# Patient Record
Sex: Male | Born: 1975
Health system: Southern US, Community
[De-identification: ages and names within clinical notes are randomized; demographics above are authoritative.]

## PROBLEM LIST (undated history)

## (undated) DIAGNOSIS — I10 Essential (primary) hypertension: Secondary | ICD-10-CM

## (undated) HISTORY — PX: FINGER SURGERY: SHX640

## (undated) HISTORY — DX: Essential (primary) hypertension: I10

---

## 1998-04-13 ENCOUNTER — Emergency Department (HOSPITAL_COMMUNITY): Admission: EM | Admit: 1998-04-13 | Discharge: 1998-04-13 | Payer: Self-pay | Admitting: Emergency Medicine

## 1998-04-13 ENCOUNTER — Encounter: Payer: Self-pay | Admitting: Emergency Medicine

## 1998-07-17 ENCOUNTER — Emergency Department (HOSPITAL_COMMUNITY): Admission: EM | Admit: 1998-07-17 | Discharge: 1998-07-17 | Payer: Self-pay | Admitting: Emergency Medicine

## 1999-11-02 ENCOUNTER — Emergency Department (HOSPITAL_COMMUNITY): Admission: EM | Admit: 1999-11-02 | Discharge: 1999-11-02 | Payer: Self-pay | Admitting: Emergency Medicine

## 2000-02-28 ENCOUNTER — Emergency Department (HOSPITAL_COMMUNITY): Admission: EM | Admit: 2000-02-28 | Discharge: 2000-02-28 | Payer: Self-pay | Admitting: Emergency Medicine

## 2000-02-28 ENCOUNTER — Encounter: Payer: Self-pay | Admitting: Emergency Medicine

## 2001-05-25 ENCOUNTER — Emergency Department (HOSPITAL_COMMUNITY): Admission: EM | Admit: 2001-05-25 | Discharge: 2001-05-25 | Payer: Self-pay | Admitting: *Deleted

## 2003-05-27 ENCOUNTER — Emergency Department (HOSPITAL_COMMUNITY): Admission: EM | Admit: 2003-05-27 | Discharge: 2003-05-27 | Payer: Self-pay | Admitting: Emergency Medicine

## 2003-12-12 ENCOUNTER — Emergency Department (HOSPITAL_COMMUNITY): Admission: EM | Admit: 2003-12-12 | Discharge: 2003-12-12 | Payer: Self-pay

## 2004-06-09 ENCOUNTER — Emergency Department (HOSPITAL_COMMUNITY): Admission: EM | Admit: 2004-06-09 | Discharge: 2004-06-09 | Payer: Self-pay | Admitting: Emergency Medicine

## 2005-10-27 ENCOUNTER — Emergency Department (HOSPITAL_COMMUNITY): Admission: EM | Admit: 2005-10-27 | Discharge: 2005-10-28 | Payer: Self-pay | Admitting: Emergency Medicine

## 2007-05-11 ENCOUNTER — Observation Stay (HOSPITAL_COMMUNITY): Admission: EM | Admit: 2007-05-11 | Discharge: 2007-05-12 | Payer: Self-pay | Admitting: Emergency Medicine

## 2007-05-23 ENCOUNTER — Emergency Department (HOSPITAL_COMMUNITY): Admission: EM | Admit: 2007-05-23 | Discharge: 2007-05-23 | Payer: Self-pay | Admitting: Emergency Medicine

## 2008-11-13 ENCOUNTER — Emergency Department (HOSPITAL_COMMUNITY): Admission: EM | Admit: 2008-11-13 | Discharge: 2008-11-13 | Payer: Self-pay | Admitting: Emergency Medicine

## 2009-03-12 ENCOUNTER — Emergency Department (HOSPITAL_COMMUNITY): Admission: EM | Admit: 2009-03-12 | Discharge: 2009-03-12 | Payer: Self-pay | Admitting: Emergency Medicine

## 2009-03-14 ENCOUNTER — Emergency Department (HOSPITAL_COMMUNITY): Admission: EM | Admit: 2009-03-14 | Discharge: 2009-03-14 | Payer: Self-pay | Admitting: Family Medicine

## 2009-09-02 ENCOUNTER — Emergency Department (HOSPITAL_COMMUNITY): Admission: EM | Admit: 2009-09-02 | Discharge: 2009-09-02 | Payer: Self-pay | Admitting: Emergency Medicine

## 2010-05-06 ENCOUNTER — Inpatient Hospital Stay (HOSPITAL_COMMUNITY)
Admission: EM | Admit: 2010-05-06 | Discharge: 2010-05-09 | DRG: 139 | Disposition: A | Discharge status: Home / Self Care | Payer: BC Managed Care – PPO | Attending: Internal Medicine | Admitting: Internal Medicine

## 2010-05-06 ENCOUNTER — Emergency Department (HOSPITAL_COMMUNITY): Payer: BC Managed Care – PPO

## 2010-05-06 DIAGNOSIS — R0789 Other chest pain: Secondary | ICD-10-CM | POA: Diagnosis present

## 2010-05-06 DIAGNOSIS — I4891 Unspecified atrial fibrillation: Principal | ICD-10-CM | POA: Diagnosis present

## 2010-05-06 DIAGNOSIS — R0602 Shortness of breath: Secondary | ICD-10-CM | POA: Diagnosis present

## 2010-05-06 DIAGNOSIS — I10 Essential (primary) hypertension: Secondary | ICD-10-CM | POA: Diagnosis present

## 2010-05-06 LAB — DIFFERENTIAL
Basophils Absolute: 0 10*3/uL (ref 0.0–0.1)
Lymphocytes Relative: 29 % (ref 12–46)
Lymphs Abs: 3.1 10*3/uL (ref 0.7–4.0)
Monocytes Absolute: 0.8 10*3/uL (ref 0.1–1.0)
Monocytes Relative: 7 % (ref 3–12)
Neutro Abs: 6.7 10*3/uL (ref 1.7–7.7)

## 2010-05-06 LAB — BASIC METABOLIC PANEL
CO2: 29 mEq/L (ref 19–32)
Calcium: 9.3 mg/dL (ref 8.4–10.5)
GFR calc Af Amer: >60 mL/min (ref >60)
GFR calc non Af Amer: >60 mL/min (ref >60)
Sodium: 140 mEq/L (ref 135–145)

## 2010-05-06 LAB — URINALYSIS, ROUTINE W REFLEX MICROSCOPIC
Ketones, ur: NEGATIVE mg/dL
Nitrite: NEGATIVE
Protein, ur: NEGATIVE mg/dL
pH: 6 (ref 5.0–8.0)

## 2010-05-06 LAB — CBC
HCT: 45.8 % (ref 39.0–52.0)
Hemoglobin: 15.9 g/dL (ref 13.0–17.0)
MCH: 29.6 pg (ref 26.0–34.0)
MCHC: 34.7 g/dL (ref 30.0–36.0)
MCV: 85.3 fL (ref 78.0–100.0)

## 2010-05-06 LAB — D-DIMER, QUANTITATIVE: D-Dimer, Quant: <0.22 ug/mL-FEU (ref 0.00–0.48)

## 2010-05-06 LAB — POCT CARDIAC MARKERS: Myoglobin, poc: 84.1 ng/mL (ref 12–200)

## 2010-05-07 LAB — MRSA PCR SCREENING: MRSA by PCR: NEGATIVE

## 2010-05-07 LAB — CBC
HCT: 46.4 % (ref 39.0–52.0)
MCHC: 34.9 g/dL (ref 30.0–36.0)
MCV: 84.4 fL (ref 78.0–100.0)
RDW: 12 % (ref 11.5–15.5)

## 2010-05-07 LAB — LIPID PANEL
Cholesterol: 133 mg/dL (ref 0–200)
LDL Cholesterol: 77 mg/dL (ref 0–99)
Total CHOL/HDL Ratio: 6 RATIO
Triglycerides: 169 mg/dL — ABNORMAL HIGH (ref <150)
VLDL: 34 mg/dL (ref 0–40)

## 2010-05-07 LAB — BASIC METABOLIC PANEL
BUN: 11 mg/dL (ref 6–23)
Calcium: 9.3 mg/dL (ref 8.4–10.5)
GFR calc non Af Amer: >60 mL/min (ref >60)
Glucose, Bld: 109 mg/dL — ABNORMAL HIGH (ref 70–99)

## 2010-05-07 LAB — HEMOGLOBIN A1C
Hgb A1c MFr Bld: 5.5 % (ref <5.7)
Mean Plasma Glucose: 111 mg/dL (ref <117)

## 2010-05-07 LAB — HEPARIN LEVEL (UNFRACTIONATED)
Heparin Unfractionated: 0.12 IU/mL — ABNORMAL LOW (ref 0.30–0.70)
Heparin Unfractionated: 0.16 IU/mL — ABNORMAL LOW (ref 0.30–0.70)

## 2010-05-07 LAB — TROPONIN I: Troponin I: 0.01 ng/mL (ref 0.00–0.06)

## 2010-05-07 LAB — CARDIAC PANEL(CRET KIN+CKTOT+MB+TROPI)
Relative Index: 2 (ref 0.0–2.5)
Relative Index: 2 (ref 0.0–2.5)

## 2010-05-07 LAB — CK TOTAL AND CKMB (NOT AT ARMC)
CK, MB: 3 ng/mL (ref 0.3–4.0)
Relative Index: 1.8 (ref 0.0–2.5)
Total CK: 166 U/L (ref 7–232)

## 2010-05-08 LAB — CBC
HCT: 46.8 % (ref 39.0–52.0)
Hemoglobin: 16.4 g/dL (ref 13.0–17.0)
RBC: 5.52 MIL/uL (ref 4.22–5.81)
WBC: 8.9 10*3/uL (ref 4.0–10.5)

## 2010-05-08 LAB — HEPARIN LEVEL (UNFRACTIONATED): Heparin Unfractionated: 0.22 IU/mL — ABNORMAL LOW (ref 0.30–0.70)

## 2010-05-09 LAB — CBC
HCT: 49.4 % (ref 39.0–52.0)
Hemoglobin: 17.3 g/dL — ABNORMAL HIGH (ref 13.0–17.0)
RBC: 5.83 MIL/uL — ABNORMAL HIGH (ref 4.22–5.81)
RDW: 12 % (ref 11.5–15.5)
WBC: 8 10*3/uL (ref 4.0–10.5)

## 2010-05-14 NOTE — H&P (Signed)
NAMEESLEY, Charles Olsen               ACCOUNT NO.:  [id number]  MEDICAL RECORD NO.:  [id number]           PATIENT TYPE:  E  LOCATION:  WLED                         FACILITY:  [facility]  PHYSICIAN:  Della Goo, M.D. DATE OF BIRTH:  [birth year]  DATE OF ADMISSION:  05/06/2010 DATE OF DISCHARGE:                             HISTORY & PHYSICAL   CHIEF COMPLAINT:  Chest pain, shortness of breath, and rapid heart rate.  HISTORY OF PRESENT ILLNESS:  This is a 35 year old Caucasian gentleman who was in his usual state of health up until this morning when he woke up with the feeling like having a discomfort after sleeping in a wrong position on the left side.  However, he proceeded to making the bed which only made the discomfort even more pronounced and also he started having some mild shortness of breath.  The patient went downstairs to have breakfast with his wife and that is when he started experiencing increased shortness of breath and chest discomfort along with the palpitations.  He asked his wife to check his pulse, but she could not feel anything, although the patient continued experiencing rapid and irregular heart rhythm.  The sensation continued through the day and eventually he presented to the emergency department at [facility] for evaluation.  Here on arrival, his EKG showed atrial fibrillation with rapid ventricular response.  The patient was given Cardizem bolus and drip which brought his heart rate to high 90s and also alleviated the chest discomfort.  PAST MEDICAL HISTORY:  He denies significant medical history except transient hypertension when he sustained the right knee injury by falling a tree.  At that time, he was taking some antihypertensive agent, but during the region office visit, the medication was discontinued because blood pressure was well controlled.  He also has a history of right gluteal abscess in the emergency room mentioned.  Past medical  history of MRSA.  FAMILY HISTORY:  Significant for coronary artery disease and hypertension.  SOCIAL HISTORY:  He is married.  He and his wife have eight children and six grandchildren.  The patient is a nonsmoker.  Does not use any illicit drugs and rarely uses alcohol for social occasions.  MEDICATIONS:  The patient is not on any home meds.  ALLERGIES:  OXYCODONE that causes severe hives and the itching.  CODE STATUS:  The patient is a full code.  REVIEW OF SYSTEMS:  Initially, the patient experienced chest discomfort like a pressure, shortness of breath, or rapid heart rate.  Denied any dizziness or lightheadedness.  He denied any myalgia, arthralgia, night sweats, or fever.  He also denied indigestion, nausea, vomiting, diarrhea, constipation, hematuria, or dysuria.  All other review of systems is negative except occasional headaches for which he takes Goody's Powder or aspirin.  PHYSICAL EXAMINATION:  VITAL SIGNS:  Blood pressure of 136/84, heart rate 128, respirations 26, temperature 98.4, and pulse oximetry 96%. HEENT: Normocephalic and atraumatic.  Extraocular movements intact. Sclerae anicteric.  Oral mucous membranes intact and moist. NECK:  Without any JVD or carotid bruits. LUNGS:  Clear to auscultation bilaterally without any signs of accessory muscle  involvement. HEART:  Rapid irregularly irregular.  S1 and S2.  No significant murmurs. ABDOMEN:  Large, but nontender, nondistended with positive although diminished bowel sounds x4. EXTREMITIES:  With intact peripheral pulses.  Lower extremities without any swelling.  There are no clubbing or nail bed cyanosis. SKIN:  Slightly hyperemic.  Warm and dry to touch.  No open lesions, sores, or rashes appreciated. NEURO:  Grossly intact.  No focal abnormalities seen.  The patient is alert and oriented x3. MUSCULOSKELETAL:  Moves all extremities independently with full range of motion.  No joint deformities.  No crepitus  palpated.  LABORATORY DATA:  White blood cell count 10.9, hemoglobin 15.9, hematocrit 45.8, and platelet count 263.  Sodium 140, potassium 4.4, chloride 105, CO2 of 29, BUN 16, creatinine 0.97, glucose 114, calcium 9.3, and magnesium 2.4.  D-dimer is less than 0.22.  Chest x-ray showed cardiomegaly with lower lung volumes.  EKG showed atrial fibrillation with rapid ventricular response, heart rate 133, nonspecific ST-T wave changes.  IMPRESSION AND PLAN: 1. New-onset rapid atrial fibrillation - the patient will be admitted     to step-down unit.  We will add heparin for thromboembolic event     prophylaxis.  Also, we will adjust the dose of Cardizem drip for     better heart rate control.  The patient will have TSH checked and     will follow PTT and also establish PT/INR in case if consideration     will be given to initiation of Coumadin therapy. 2. The patient might need cardiology consult if initiation of     antiarrhythmic therapy or cardioversion needed.  He does not     convert to sinus rhythm spontaneously. 3. Chest pain/discomfort with shortness of breath - resolved.  Chest x-     ray revealed cardiomegaly.  We will order a 2-D echo to evaluate     valvular structure and rule out wall motion abnormalities as well     as ejection fraction.  We will also check serial of cardiac enzymes     to rule out underlying ischemic disease. 4. Mild hyperglycemia.  We will order hemoglobin A1c to assess the     glycemic control over the last 3 months. 5. Deep vein thrombosis prophylaxis will be achieved with heparin     infusion.  Case was discussed with Dr. Lovell Sheehan who agreed with the above.     Raymon Mutton, P.A.   ______________________________ Della Goo, M.D.    MK/MEDQ  D:  05/06/2010  T:  05/06/2010  Job:  [id number]  Electronically Signed by Raymon Mutton P.A. on 05/09/2010 08:40:46 PM Electronically Signed by Della Goo M.D. on 05/14/2010 08:26:19 PM

## 2010-05-18 ENCOUNTER — Ambulatory Visit: Payer: Self-pay | Admitting: Family Medicine

## 2010-05-18 DIAGNOSIS — Z0289 Encounter for other administrative examinations: Secondary | ICD-10-CM

## 2010-05-24 NOTE — Discharge Summary (Signed)
[name], [name]               ACCOUNT NO.:  [id number]  MEDICAL RECORD NO.:  [id number]           PATIENT TYPE:  I  LOCATION:  1414                         FACILITY:  [facility]  PHYSICIAN:  Ramiro Harvest, MD    DATE OF BIRTH:  [birth year]  DATE OF ADMISSION:  05/06/2010 DATE OF DISCHARGE:  05/09/2010                        DISCHARGE SUMMARY - REFERRING   PRIMARY CARE PHYSICIAN:  Going to be with [facility].  The patient is going to pick which her particular MD is.  DISCHARGE DIAGNOSES: 1. Idiopathic new onset paroxysmal atrial fibrillation, now in sinus     rhythm. 2. Hypertension, stable. 3. History of right gluteal abscess in the past. 4. History of right knee tibial eminence fracture secondary to crush     injury with suspected ligamentous derangements May 11, 2007.  DISCHARGE MEDICATIONS: 1. Diltiazem 240 mg p.o. daily. 2. Aspirin 325 mg p.o. daily.  DISPOSITION AND FOLLOWUP:  The patient will be discharged home.  The patient is going to pick his primary MD.  He will need to follow up 1 week post discharge to follow up on his idiopathic paroxysmal atrial fibrillation.  CONSULTATIONS DONE:  None.  PROCEDURES PERFORMED:  A chest x-ray was done May 06, 2010, that showed cardiomegaly without edema. A 2-D echo was done on May 07, 2010, that showed a normal left ventricular cavity size, mild focal basal hypertrophy of the septum, systolic function was normal, EF 60-65%, no diagnostic regional wall motion abnormality was identified.  This possibility cannot be completely excluded on the basis of this study.  Left ventricular diastolic function parameters were normal.  Left atrium was mildly dilated.  BRIEF ADMISSION HISTORY AND PHYSICAL:  Charles Olsen is a 35 year old gentleman who was in his usual state of health up until the morning of admission when he woke up with a feeling of having a discomfort after sleeping in the wrong position on  the left side.  However, he proceeded to making the bed which only made the discomfort even more pronounced and he started having some mild shortness of breath.  The patient went downstairs to have breakfast with his wife, and at that time, he started experiencing some increased shortness of breath and chest discomfort along with palpitations.  He asked his wife to check his pulse where she could not feel anything, although the patient continued experiencing rapid irregular heart rhythm and sensation continued through the day, eventually presented to the ED at [facility] for evaluation.  On arrival. EKG showed atrial fibrillation with RVR.  The patient was given a Cardizem bolus and a drip which brought his heart rate into the high 90s and alleviated his chest discomfort.  For the rest of admission history and physical, please see H and P dictated per Dr. Lovell Sheehan.  HOSPITAL COURSE: 1. Idiopathic new onset paroxysmal atrial fibrillation.  The patient     was admitted with rapid Afib.  He was admitted to the step-down     unit.  He was placed on IV Cardizem drip as well as placed on     heparin drip as well.  Cardiac  enzymes q.8h. x3 were cycled which     were negative x3.  TSH which was obtained was within normal limits.     A D-dimer which was obtained during the hospitalization was less     than 0.22.  A 2-D echo was also obtained with results as stated     above which had a normal ejection fraction of 60-65% and no wall     motion abnormalities.  The patient converted spontaneously into a     normal sinus rhythm by hospital day #2.  His heart rate was     controlled on IV Cardizem.  He was subsequently transitioned to     oral Cardizem at 240 mg daily.  The patient was maintained on this     with a good rate control.  The patient's CHADS2 score was equal to     1 for his hypertension, and as such, he was placed on aspirin.  The     patient remained in stable condition.  Did not have  any further     chest discomfort.  Did not have any further shortness of breath.     Did not have any other palpitations.  The patient was rate     controlled during the hospitalization and will be discharged home     on oral Cardizem with a close followup with his PCP.  The patient     will be discharged home in stable and improved condition.  Once D-     dimer was negative, heparin was discontinued.  The patient was     subsequently placed on aspirin. 2. Hypertension remained stable during the hospitalization.  The     patient was on Cardizem and will be discharged home on Cardizem.  On day of discharge, the patient was in stable and improved condition.  VITAL SIGNS ON DAY OF DISCHARGE:  Temperature 97.8, pulse of 90, respirations 18, blood pressure 117/78, and satting 93% on room air.  DISCHARGE LABS:  CBC; white count 8.0, hemoglobin 17.3, hematocrit 49.4, and a platelet count of 263,000.  It was a pleasure taking care of [name].     Ramiro Harvest, MD     DT/MEDQ  D:  05/09/2010  T:  05/09/2010  Job:  [id number]  cc:   [facility]  Electronically Signed by Ramiro Harvest MD on 05/24/2010 08:06:57 PM

## 2010-06-15 LAB — CULTURE, ROUTINE-ABSCESS

## 2010-07-24 NOTE — Consult Note (Signed)
NAMEKOREN, SERMERSHEIM               ACCOUNT NO.:  [id number]   MEDICAL RECORD NO.:  [id number]          PATIENT TYPE:  INP   LOCATION:  5010                         FACILITY:  [facility]   PHYSICIAN:  Doralee Albino. Carola Frost, M.D. DATE OF BIRTH:  13-Jun-1975   DATE OF CONSULTATION:  05/11/2007  DATE OF DISCHARGE:                                 CONSULTATION   EMERGENCY ROOM CONSULTATION   REQUESTING PHYSICIAN:  Doug Sou, M.D.   REASON FOR CONSULTATION:  Right knee crush injury.   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  It was proximally 24 inches in diameter, as a  direct below the anteromedial aspect of the knee.  Since that time, the  patient has had fairly severe pain, has been isolated to the knee.  He  has had some associated paresthesias involving the dorsal and plantar  aspects of his foot.  He denies any frank loss of motor function, distal  to the eye.  He denies other injuries, denies loss of consciousness.  Pain is throbbing in nature.   PAST MEDICAL HISTORY:  None.   PAST SURGICAL HISTORY:  Repair of left third metacarpal fracture.   FAMILY MEDICAL HISTORY:  Is noncontributory.   ALLERGIES:  NO KNOWN DRUG ALLERGIES.   MEDICATIONS:  None.   REVIEW OF SYSTEMS:  Negative for GI, GU, hematologic, respiratory,  cardiac problems.   SOCIAL HISTORY:  The patient is accompanied by his wife.  He is employed  as a Copywriter, advertising, does not smoke.  The patient does not drink alcohol.   PHYSICAL EXAMINATION:  GENERAL:  The patient is alert and oriented x4,  appropriate well-nourished.  HEENT:  Normocephalic, atraumatic.  EXTREMITIES:  Upper extremities above the shoulders, elbows, wrists and  hands without ecchymosis, crepitus, blocked motion or diminished  strength and.  Pulses are without  abnormality, the right lower extremity  notable for negative hip tenderness or ecchymosis or crepitus.  None  were appreciated at the ankle either.  He does have obvious ecchymosis  and swelling about the medial aspect of his knee, and he also has what  appears almost be a traction wound on the posterior lateral aspect of  the knee, it is 2.5 cm.  There is no significant bleeding there,  swelling laterally as well.  Dorsalis pedis pulse is 2+.  He has  decreased sensation in the superficial and deep peroneal nerve  distributions, as well as sural and tibial nerve distributions also.  He  is able flex and extend the great and lesser toes without difficulty.  He is able to perform a straight leg raising with some difficulty, does  not have any palpable defect either of the patellar tendon of the  quadriceps tendons.  Dorsalis pedis pulses 2+, contralateral extremity  was examined as well for comparison, without abnormalities noted.   X-RAYS:  AP and lateral, as well  as oblique films of the right knee  demonstrate no evidence of dislocation.  There is an effusion present.  There is a tibial eminence fracture with minimal displacement, no  significant jointline asymmetry.   ASSESSMENT:  Right knee tibial eminence fracture, secondary to crush  injury, suspected ligamentous derangement.   PLAN:  The patient will again be admitted and observed overnight for  pain control with ice, elevation, knee immobilizer.  Plan is for MRI  later today or tomorrow, to evaluate specifically posterolateral  __________ structures which could require earlier rather than delayed  repair.  The patient understands plan as does his wife, and they are in  agreement.      Doralee Albino. Carola Frost, M.D.  Electronically Signed     MHH/MEDQ  D:  05/11/2007  T:  05/11/2007  Job:  [id number]

## 2010-09-29 ENCOUNTER — Emergency Department (HOSPITAL_COMMUNITY)
Admission: EM | Admit: 2010-09-29 | Discharge: 2010-09-29 | Disposition: A | Discharge status: Home / Self Care | Payer: BC Managed Care – PPO | Attending: Emergency Medicine | Admitting: Emergency Medicine

## 2010-09-29 DIAGNOSIS — L089 Local infection of the skin and subcutaneous tissue, unspecified: Secondary | ICD-10-CM | POA: Insufficient documentation

## 2010-09-29 DIAGNOSIS — L909 Atrophic disorder of skin, unspecified: Secondary | ICD-10-CM | POA: Insufficient documentation

## 2010-09-29 DIAGNOSIS — Z8614 Personal history of Methicillin resistant Staphylococcus aureus infection: Secondary | ICD-10-CM | POA: Insufficient documentation

## 2010-12-03 LAB — DIFFERENTIAL
Basophils Absolute: 0
Basophils Relative: 0
Monocytes Absolute: 0.6
Neutro Abs: 4.2

## 2010-12-03 LAB — CBC
Hemoglobin: 14.9
MCHC: 35
Platelets: 343
RDW: 12.2

## 2011-09-02 ENCOUNTER — Encounter (HOSPITAL_COMMUNITY): Payer: Self-pay | Admitting: *Deleted

## 2011-09-02 ENCOUNTER — Emergency Department (HOSPITAL_COMMUNITY): Payer: BC Managed Care – PPO

## 2011-09-02 ENCOUNTER — Emergency Department (HOSPITAL_COMMUNITY)
Admission: EM | Admit: 2011-09-02 | Discharge: 2011-09-02 | Disposition: A | Discharge status: Home / Self Care | Payer: BC Managed Care – PPO | Attending: Emergency Medicine | Admitting: Emergency Medicine

## 2011-09-02 DIAGNOSIS — J3489 Other specified disorders of nose and nasal sinuses: Secondary | ICD-10-CM | POA: Insufficient documentation

## 2011-09-02 DIAGNOSIS — J40 Bronchitis, not specified as acute or chronic: Secondary | ICD-10-CM

## 2011-09-02 MED ORDER — BENZONATATE 100 MG PO CAPS: 100.0000 mg | ORAL_CAPSULE | Freq: Three times a day (TID) | ORAL | Status: AC

## 2011-09-02 MED ORDER — PREDNISONE 10 MG PO TABS: 50.0000 mg | ORAL_TABLET | Freq: Every day | ORAL | Status: DC

## 2011-09-02 MED ORDER — ALBUTEROL SULFATE HFA 108 (90 BASE) MCG/ACT IN AERS
2.0000 | INHALATION_SPRAY | Freq: Four times a day (QID) | RESPIRATORY_TRACT | Status: DC
  Administered 2011-09-02: 2 via RESPIRATORY_TRACT
  Filled 2011-09-02: qty 6.7

## 2011-09-02 NOTE — ED Provider Notes (Signed)
History     CSN: [id number]  Arrival date & time 09/02/11  1545   First MD Initiated Contact with Patient 09/02/11 1957      Chief Complaint  Patient presents with  . Cough    (Consider location/radiation/quality/duration/timing/severity/associated sxs/prior treatment) Patient is a 36 y.o. male presenting with cough. The history is provided by the patient.  Cough This is a new problem. Episode onset: 3 days ago. The problem occurs constantly. The problem has been gradually worsening. The cough is non-productive. There has been no fever. Associated symptoms include rhinorrhea. Pertinent negatives include no chest pain, no chills, no sweats, no weight loss, no ear congestion, no ear pain, no headaches, no sore throat, no myalgias, no shortness of breath, no wheezing and no eye redness. He has tried decongestants for the symptoms. The treatment provided no relief. He is not a smoker.    History reviewed. No pertinent past medical history.  History reviewed. No pertinent past surgical history.  History reviewed. No pertinent family history.  History  Substance Use Topics  . Smoking status: Not on file  . Smokeless tobacco: Not on file  . Alcohol Use: No      Review of Systems  Constitutional: Negative for chills, weight loss, activity change and appetite change.  HENT: Positive for rhinorrhea. Negative for ear pain and sore throat.   Eyes: Negative for redness.  Respiratory: Positive for cough. Negative for shortness of breath and wheezing.   Cardiovascular: Negative for chest pain and leg swelling.  Gastrointestinal: Negative for nausea and vomiting.  Musculoskeletal: Negative for myalgias.  Skin: Negative for rash.  Neurological: Negative for headaches.    Allergies  Oxycodone  Home Medications   Current Outpatient Rx  Name Route Sig Dispense Refill  . PSEUDOEPHEDRINE-APAP-DM 40-981-19 MG/30ML PO LIQD Oral Take 30 mLs by mouth every 4 (four) hours as needed. COUGH       BP 148/93  Pulse 91  Temp 98.6 F (37 C) (Oral)  Resp 18  Ht 6\' 4"  (1.93 m)  Wt 310 lb (140.615 kg)  BMI 37.73 kg/m2  SpO2 99%  Physical Exam  Nursing note and vitals reviewed. Constitutional: He appears well-developed and well-nourished. No distress.  HENT:  Head: Normocephalic and atraumatic.  Right Ear: External ear normal.  Left Ear: External ear normal.  Mouth/Throat: Oropharynx is clear and moist. No oropharyngeal exudate.       TMs nl Sinuses NT to palp  Eyes:       Normal appearance  Neck: Normal range of motion.  Cardiovascular: Normal rate, regular rhythm and normal heart sounds.   Pulmonary/Chest: Effort normal and breath sounds normal. He exhibits no tenderness.       Coarse breath sounds without obvious wheezing  Abdominal: Soft. Bowel sounds are normal. There is no tenderness. There is no rebound and no guarding.  Musculoskeletal: Normal range of motion. He exhibits no edema and no tenderness.  Neurological: He is alert.  Skin: Skin is warm and dry. He is not diaphoretic.  Psychiatric: He has a normal mood and affect.    ED Course  Procedures (including critical care time)  Labs Reviewed - No data to display Dg Chest 2 View  09/02/2011  *RADIOLOGY REPORT*  Clinical Data: 36 year old male with cough and chest pain.  CHEST - 2 VIEW  Comparison: 05/06/2010 earlier.  Findings: Stable cardiac size at the upper limits of normal to mildly enlarged. Other mediastinal contours are within normal limits.  Visualized tracheal air column  is within normal limits. No pneumothorax, pulmonary edema, pleural effusion or confluent pulmonary opacity. No acute osseous abnormality identified. Degenerative or remote post-traumatic changes at the right acromion.  IMPRESSION: No acute cardiopulmonary abnormality.  Original Report Authenticated By: Harley Hallmark, M.D.     1. Bronchitis       MDM  Patient with 3 days of cough. On exam, coarse breath sounds. No fever. Normal  vital signs. Likely bronchitis. Will treat symptomatically. Reasons to return to the emergency discussed.        Grant Fontana, PA-C 09/03/11 1601  Grant Fontana, PA-C 09/03/11 [phone]

## 2011-09-02 NOTE — ED Notes (Signed)
Pt verbalizes understanding 

## 2011-09-02 NOTE — ED Notes (Signed)
Pt c/o cough, sinus pain, back pain and pain worse in chest with cough.

## 2011-09-02 NOTE — Discharge Instructions (Signed)

## 2011-09-03 NOTE — ED Provider Notes (Signed)
Medical screening examination/treatment/procedure(s) were performed by non-physician practitioner and as supervising physician I was immediately available for consultation/collaboration.  Flint Melter, MD 09/03/11 1736

## 2013-01-11 ENCOUNTER — Emergency Department (HOSPITAL_COMMUNITY)
Admission: EM | Admit: 2013-01-11 | Discharge: 2013-01-11 | Disposition: A | Discharge status: Home / Self Care | Payer: BC Managed Care – PPO | Attending: Emergency Medicine | Admitting: Emergency Medicine

## 2013-01-11 ENCOUNTER — Encounter (HOSPITAL_COMMUNITY): Payer: Self-pay | Admitting: Emergency Medicine

## 2013-01-11 DIAGNOSIS — G510 Bell's palsy: Secondary | ICD-10-CM | POA: Insufficient documentation

## 2013-01-11 MED ORDER — ARTIFICIAL TEARS OP OINT: TOPICAL_OINTMENT | OPHTHALMIC | Status: DC | PRN

## 2013-01-11 MED ORDER — PREDNISONE 20 MG PO TABS: 60.0000 mg | ORAL_TABLET | Freq: Every day | ORAL | Status: DC

## 2013-01-11 MED ORDER — VALACYCLOVIR HCL 1 G PO TABS: 1000.0000 mg | ORAL_TABLET | Freq: Three times a day (TID) | ORAL | Status: DC

## 2013-01-11 NOTE — ED Notes (Signed)
Pt c/o rt sided facial numbness with rt eye drainage and facial droop since 07:15am, no other weakness,

## 2013-01-11 NOTE — ED Provider Notes (Signed)
CSN: [id number]     Arrival date & time 01/11/13  0840 History   First MD Initiated Contact with Patient 01/11/13 [phone]     Chief Complaint  Patient presents with  . Facial Droop   (Consider location/radiation/quality/duration/timing/severity/associated sxs/prior Treatment) HPI Pt reports 'drainage' from R eye over the last 2 days he though was allergies but this morning woke up with unusual sensation R sided of face which progressed to facial droop. Associated with taste disturbance recently as well. No fever. No blurry vision. He denies arm or leg weakness or numbness, no gait disturbance No past medical history on file. No past surgical history on file. No family history on file. History  Substance Use Topics  . Smoking status: Not on file  . Smokeless tobacco: Not on file  . Alcohol Use: No    Review of Systems All other systems reviewed and are negative except as noted in HPI.   Allergies  Oxycodone  Home Medications   Current Outpatient Rx  Name  Route  Sig  Dispense  Refill  . predniSONE (DELTASONE) 10 MG tablet   Oral   Take 5 tablets (50 mg total) by mouth daily. Take for 3 days.   15 tablet   0   . Pseudoephedrine-APAP-DM (DAYQUIL MULTI-SYMPTOM) 60-650-20 MG/30ML LIQD   Oral   Take 30 mLs by mouth every 4 (four) hours as needed. COUGH          BP 169/98  Pulse 92  Temp(Src) 98.8 F (37.1 C)  Resp 20  SpO2 99% Physical Exam  Nursing note and vitals reviewed. Constitutional: He is oriented to person, place, and time. He appears well-developed and well-nourished.  HENT:  Head: Normocephalic and atraumatic.  Eyes: EOM are normal. Pupils are equal, round, and reactive to light.  Neck: Normal range of motion. Neck supple.  Cardiovascular: Normal rate, normal heart sounds and intact distal pulses.   Pulmonary/Chest: Effort normal and breath sounds normal.  Abdominal: Bowel sounds are normal. He exhibits no distension. There is no tenderness.   Musculoskeletal: Normal range of motion. He exhibits no edema and no tenderness.  Neurological: He is alert and oriented to person, place, and time. He has normal strength. He displays normal reflexes. A cranial nerve deficit (R sided facial droop includes forehead) is present. No sensory deficit. He exhibits normal muscle tone. Coordination and gait normal.  Skin: Skin is warm and dry. No rash noted.  Psychiatric: He has a normal mood and affect.    ED Course  Procedures (including critical care time) Labs Review Labs Reviewed - No data to display Imaging Review No results found.  EKG Interpretation   None       MDM   1. Bell's palsy     Symptoms consistent with Bell's Palsy. No concern for central process. Start prednisone, acyclovir and Neuro followup if not improving.     Charles B. Bernette Mayers, MD 01/11/13 [phone]

## 2013-01-18 ENCOUNTER — Ambulatory Visit: Payer: BC Managed Care – PPO | Admitting: Neurology

## 2013-01-18 ENCOUNTER — Ambulatory Visit (INDEPENDENT_AMBULATORY_CARE_PROVIDER_SITE_OTHER): Payer: BC Managed Care – PPO | Admitting: Diagnostic Neuroimaging

## 2013-01-18 ENCOUNTER — Encounter: Payer: Self-pay | Admitting: Diagnostic Neuroimaging

## 2013-01-18 VITALS — BP 138/89 | HR 80 | Temp 98.4°F | Ht 76.0 in | Wt 300.0 lb

## 2013-01-18 DIAGNOSIS — G510 Bell's palsy: Secondary | ICD-10-CM | POA: Insufficient documentation

## 2013-01-18 NOTE — Patient Instructions (Signed)
Use eye drops; follow up with eye doctor in 1-2 weeks if eye issues continue.

## 2013-01-18 NOTE — Progress Notes (Signed)
[facility]  PATIENT: Charles Olsen DOB: Feb 27, 1976  REFERRING CLINICIAN: ER  HISTORY FROM: patient REASON FOR VISIT: new consult   HISTORICAL  CHIEF COMPLAINT:  Chief Complaint  Patient presents with  . Facial Droop    HISTORY OF PRESENT ILLNESS:   37 year old right-handed male here for evaluation of right Bell's palsy.  01/09/13 patient noticed decreased sense of taste. He had mild tingling on the right side of his face. 01/11/13 patient went to work, had a drink of water and noticed that it was filling of the right side. Patient was emergency room and was diagnosed with right peripheral facial palsy, likely Bell's palsy. He was treated with prednisone and acyclovir for 7 days.  Since that time symptoms have been stable. He is completed his course of prednisone and acyclovir.  Patient denies any exposure to tick bites. No symptoms in his left face, arms or legs. He does have some soreness in the right side of the face.   REVIEW OF SYSTEMS: Full 14 system review of systems performed and notable only for  blurred vision headache numbness snoring.   ALLERGIES: Allergies  Allergen Reactions  . Oxycodone     Pt states "i can take it if has tylenol in it."    HOME MEDICATIONS: Outpatient Prescriptions Prior to Visit  Medication Sig Dispense Refill  . artificial tears (LACRILUBE) OINT ophthalmic ointment Apply to eye every 4 (four) hours as needed.  3.5 g  0  . predniSONE (DELTASONE) 20 MG tablet Take 3 tablets (60 mg total) by mouth daily. Take for 7 days.  21 tablet  0  . valACYclovir (VALTREX) 1000 MG tablet Take 1 tablet (1,000 mg total) by mouth 3 (three) times daily.  21 tablet  0   No facility-administered medications prior to visit.    PAST MEDICAL HISTORY: Past Medical History  Diagnosis Date  . Hypertension     PAST SURGICAL HISTORY: Past Surgical History  Procedure Laterality Date  . Finger surgery Left     middle finger     FAMILY HISTORY: Family History  Problem Relation Age of Onset  . Alzheimer's disease Maternal Grandmother   . Cancer Paternal Grandmother     SOCIAL HISTORY:  History   Social History  . Marital Status: Married    Spouse Name: [name]     Number of Children: 1  . 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 Smoking status: Never Smoker   . Smokeless tobacco: Never Used  . Alcohol Use: Yes     Comment: occasionally  . Drug Use: No  . Sexual Activity: Not on file   Other Topics Concern  . Not on file   Social History Narrative   Patient lives at home with spouse.   Caffeine Use: 1 soda every couple days     PHYSICAL EXAM  Filed Vitals:   01/18/13 1311  BP: 138/89  Pulse: 80  Temp: 98.4 F (36.9 C)  TempSrc: Oral  Height: 6\' 4"  (1.93 m)  Weight: 300 lb (136.079 kg)    Not recorded    Body mass index is 36.53 kg/(m^2).  GENERAL EXAM: Patient is in no distress  CARDIOVASCULAR: Regular rate and rhythm, no murmurs, no carotid bruits  NEUROLOGIC: MENTAL STATUS: awake, alert, language fluent, comprehension intact, naming intact CRANIAL NERVE: no papilledema on fundoscopic exam, pupils equal and reactive to light, visual fields full  to confrontation, extraocular muscles intact, no nystagmus, SLIGHTLY DECR LT ON RIGHT V1, V2, V3. DECR RIGHT UPPER AND LOWER FACIAL STRENGTH. INCOMPLETE RIGHT EYE CLOSURE. Uvula midline, shoulder shrug symmetric, tongue midline. MOTOR: normal bulk and tone, full strength in the BUE, BLE SENSORY: normal and symmetric to light touch, temperature, vibration COORDINATION: finger-nose-finger, fine finger movements normal REFLEXES: deep tendon reflexes present and symmetric GAIT/STATION: narrow based gait; romberg is negative   DIAGNOSTIC DATA (LABS, IMAGING, TESTING) - I reviewed patient records, labs, notes, testing and imaging myself where  available.  Lab Results  Component Value Date   WBC 8.0 05/09/2010   HGB 17.3* 05/09/2010   HCT 49.4 05/09/2010   MCV 84.7 05/09/2010   PLT 263 05/09/2010      Component Value Date/Time   NA 136 05/07/2010 0335   K 3.7 05/07/2010 0335   CL 106 05/07/2010 0335   CO2 24 05/07/2010 0335   GLUCOSE 109* 05/07/2010 0335   BUN 11 05/07/2010 0335   CREATININE 0.85 05/07/2010 0335   CALCIUM 9.3 05/07/2010 0335   GFRNONAA >60 05/07/2010 0335   GFRAA  Value: >60        The eGFR has been calculated using the MDRD equation. This calculation has not been validated in all clinical situations. eGFR's persistently <60 mL/min signify possible Chronic Kidney Disease. 05/07/2010 0335   Lab Results  Component Value Date   CHOL  Value: 133        ATP III CLASSIFICATION:  <200     mg/dL   Desirable  161-096  mg/dL   Borderline High  >=045    mg/dL   High        06/17/8117   HDL 22* 05/07/2010   LDLCALC  Value: 77        Total Cholesterol/HDL:CHD Risk Coronary Heart Disease Risk Table                     Men   Women  1/2 Average Risk   3.4   3.3  Average Risk       5.0   4.4  2 X Average Risk   9.6   7.1  3 X Average Risk  23.4   11.0        Use the calculated Patient Ratio above and the CHD Risk Table to determine the patient's CHD Risk.        ATP III CLASSIFICATION (LDL):  <100     mg/dL   Optimal  147-829  mg/dL   Near or Above                    Optimal  130-159  mg/dL   Borderline  562-130  mg/dL   High  >865     mg/dL   Very High 7/84/6962   TRIG 169* 05/07/2010   CHOLHDL 6.0 05/07/2010   Lab Results  Component Value Date   HGBA1C  Value: 5.5 (NOTE)                                                                       According to the ADA Clinical Practice Recommendations for 2011, when HbA1c is used as a screening test:   >=6.5%  Diagnostic of Diabetes Mellitus           (if abnormal result  is confirmed)  5.7-6.4%   Increased risk of developing Diabetes Mellitus  References:Diagnosis and Classification of Diabetes  [place],Diabetes Care,2011,34(Suppl 1):S62-S69 and Standards of Medical Care in         Diabetes - 2011,Diabetes Care,2011,34  (Suppl 1):S11-S61. 05/06/2010   No results found for this basename: [id number]   Lab Results  Component Value Date   TSH 2.900 05/06/2010      ASSESSMENT AND PLAN  37 y.o. year old male here with  right Bell's palsy since 01/11/13. Mild symptoms may have started on 11/1. Patient is completed prednisone acyclovir.  PLAN: - eye care instructions reviewed; may need to see ophthalmology if he has persistent right eye pain or irritation  - Observation; recovery may take weeks to months.   Return if symptoms worsen or fail to improve.    Suanne Marker, MD 01/18/2013, 2:03 PM Certified in Neurology, Neurophysiology and Neuroimaging  [facility] [address], [address] [place], [place] [address] [phone]

## 2013-04-27 ENCOUNTER — Emergency Department (HOSPITAL_COMMUNITY)
Admission: EM | Admit: 2013-04-27 | Discharge: 2013-04-27 | Disposition: A | Discharge status: Home / Self Care | Payer: BC Managed Care – PPO | Attending: Emergency Medicine | Admitting: Emergency Medicine

## 2013-04-27 ENCOUNTER — Encounter (HOSPITAL_COMMUNITY): Payer: Self-pay | Admitting: Emergency Medicine

## 2013-04-27 DIAGNOSIS — Y9239 Other specified sports and athletic area as the place of occurrence of the external cause: Secondary | ICD-10-CM | POA: Insufficient documentation

## 2013-04-27 DIAGNOSIS — M545 Low back pain, unspecified: Secondary | ICD-10-CM

## 2013-04-27 DIAGNOSIS — R296 Repeated falls: Secondary | ICD-10-CM | POA: Insufficient documentation

## 2013-04-27 DIAGNOSIS — R269 Unspecified abnormalities of gait and mobility: Secondary | ICD-10-CM | POA: Insufficient documentation

## 2013-04-27 DIAGNOSIS — I10 Essential (primary) hypertension: Secondary | ICD-10-CM | POA: Insufficient documentation

## 2013-04-27 DIAGNOSIS — IMO0002 Reserved for concepts with insufficient information to code with codable children: Secondary | ICD-10-CM | POA: Insufficient documentation

## 2013-04-27 DIAGNOSIS — Z79899 Other long term (current) drug therapy: Secondary | ICD-10-CM | POA: Insufficient documentation

## 2013-04-27 DIAGNOSIS — S3992XA Unspecified injury of lower back, initial encounter: Secondary | ICD-10-CM

## 2013-04-27 DIAGNOSIS — Y9364 Activity, baseball: Secondary | ICD-10-CM | POA: Insufficient documentation

## 2013-04-27 DIAGNOSIS — Y92838 Other recreation area as the place of occurrence of the external cause: Secondary | ICD-10-CM

## 2013-04-27 MED ORDER — IBUPROFEN 600 MG PO TABS: 600.0000 mg | ORAL_TABLET | Freq: Four times a day (QID) | ORAL | Status: DC | PRN

## 2013-04-27 MED ORDER — DIAZEPAM 5 MG PO TABS: 5.0000 mg | ORAL_TABLET | Freq: Two times a day (BID) | ORAL | Status: DC

## 2013-04-27 NOTE — Discharge Instructions (Signed)
Back Exercises Back exercises help treat and prevent back injuries. The goal is to increase your strength in your belly (abdominal) and back muscles. These exercises can also help with flexibility. Start these exercises when told by your doctor. HOME CARE Back exercises include: Pelvic Tilt.  Lie on your back with your knees bent. Tilt your pelvis until the lower part of your back is against the floor. Hold this position 5 to 10 sec. Repeat this exercise 5 to 10 times. Knee to Chest.  Pull 1 knee up against your chest and hold for 20 to 30 seconds. Repeat this with the other knee. This may be done with the other leg straight or bent, whichever feels better. Then, pull both knees up against your chest. Sit-Ups or Curl-Ups.  Bend your knees 90 degrees. Start with tilting your pelvis, and do a partial, slow sit-up. Only lift your upper half 30 to 45 degrees off the floor. Take at least 2 to 3 seonds for each sit-up. Do not do sit-ups with your knees out straight. If partial sit-ups are difficult, simply do the above but with only tightening your belly (abdominal) muscles and holding it as told. Hip-Lift.  Lie on your back with your knees flexed 90 degrees. Push down with your feet and shoulders as you raise your hips 2 inches off the floor. Hold for 10 seconds, repeat 5 to 10 times. Back Arches.  Lie on your stomach. Prop yourself up on bent elbows. Slowly press on your hands, causing an arch in your low back. Repeat 3 to 5 times. Shoulder-Lifts.  Lie face down with arms beside your body. Keep hips and belly pressed to floor as you slowly lift your head and shoulders off the floor. Do not overdo your exercises. Be careful in the beginning. Exercises may cause you some mild back discomfort. If the pain lasts for more than 15 minutes, stop the exercises until you see your doctor. Improvement with exercise for back problems is slow.  Document Released: 03/30/2010 Document Revised: 05/20/2011  Document Reviewed: 12/27/2010 [facility] Patient Information 2014 [place], [place].  Emergency Department Resource Guide 1) Find a Doctor and Pay Out of Pocket Although you won't have to find out who is covered by your insurance plan, it is a good idea to ask around and get recommendations. You will then need to call the office and see if the doctor you have chosen will accept you as a new patient and what types of options they offer for patients who are self-pay. Some doctors offer discounts or will set up payment plans for their patients who do not have insurance, but you will need to ask so you aren't surprised when you get to your appointment.  2) Contact Your Local Health Department Not all health departments have doctors that can see patients for sick visits, but many do, so it is worth a call to see if yours does. If you don't know where your local health department is, you can check in your phone book. The CDC also has a tool to help you locate your state's health department, and many state websites also have listings of all of their local health departments.  3) Find a Walk-in Clinic If your illness is not likely to be very severe or complicated, you may want to try a walk in clinic. These are popping up all over the country in pharmacies, drugstores, and shopping centers. They're usually staffed by nurse practitioners or physician assistants that have been trained to  treat common illnesses and complaints. They're usually fairly quick and inexpensive. However, if you have serious medical issues or chronic medical problems, these are probably not your best option.  No Primary Care Doctor: - Call Health Connect at  [phone] - they can help you locate a primary care doctor that  accepts your insurance, provides certain services, etc. - Physician Referral Service- [phone]  Chronic Pain Problems: Organization         Address  Phone   Notes  [facility]  [phone]  Patients need to be referred by their primary care doctor.   Medication Assistance: Organization         Address  Phone   Notes  [facility] Medication [facility] [address] [place], [place] [address] [phone] --Must be a resident of [facility] -- Must have NO insurance coverage whatsoever (no Medicaid/ Medicare, etc.) -- The pt. MUST have a primary care doctor that directs their care regularly and follows them in the community   [facility]  [phone]   [employer]  [phone]    Agencies that provide inexpensive medical care: Organization         Address                                                       Phone                                                                            Notes  [facility]  [phone]   [facility]    [phone]   [facility] [address] [place], [place] [address] [phone]   [facility] [address] [place] [phone]   [facility]anned Parenthood    [phone]   [facility]    [phone]   [facility]  [address], [place] Phone:  [phone], Fax:  [phone] Hours of Operation:  9 am - 6 pm, M-F.  Also accepts Medicaid/Medicare and self-pay.  [facility]  [address], [place] Phone: [phone], Fax: [phone]. Hours of Operation:  8:30 am - 5:30 pm, M-F.  Also accepts Medicaid and self-pay.  [facility] [place] [address], [place] Phone: [phone]   [facility] [address] Natasha Bence [place], [place] [phone], Ext. [phone] Mondays & Thursdays: 7-9 AM.  First 15 patients are seen on a first come, first serve basis.    Medicaid-accepting [facility] Providers:  Organization         Address  Phone                                Notes  [facility] [address], [place] [phone] Also accepts self-pay patients.  [facility] [address], [place]  [phone]   [facility] [address], [place] [phone]) [phone]   [facility] [address] [place] [address], [place] [phone]) [phone]   Renaye RakersVeita Bland [address], [place]   [phone]) [phone] Only accepts [place] patients after they have their name applied to their card.   Self-Pay (no insurance) in [facility]:   Organization         Address                                                     Phone               Notes  Sickle Cell Patients, [facility] [address], [place] [phone]   [facility] [address], [place] [phone]) [phone]   [facility] [place]  [address], [place] [phone]   [facility]/Dr. Osei-Bonsu  [address] [place] [address], [place] or [phone], [place] [phone] Phone number for both [place] and [place] locations is the same.  [facility] [address], [place] [phone](336) [phone]   [facility] [address] [place] [address], [place] or [address] [phone]) [phone]   [facility] [address], [place] [phone], phone; [phone], fax Sees patients 1st and 3rd Saturday of every month.  Must not qualify for public or private insurance (i.e. Medicaid, Medicare, [place] Health Choice, Veterans' Benefits)  Household income should be no more than 200% of the poverty level The clinic cannot treat you if you are pregnant or think you are pregnant  Sexually transmitted diseases are not treated at the clinic.

## 2013-04-27 NOTE — ED Notes (Signed)
Pt a+ox4, presents with c/o 4/10 R lower back pain x1 week after playing softball.  Pt denies n/t to extremities, denies bowel/bladder changes, denies other complaints.  MAEI, ambulating independently without issue.  Speaking full/clear sentences, rr even/un-lab.  Skin PWD.  NAD.

## 2013-04-27 NOTE — ED Provider Notes (Signed)
CSN: [id number]     Arrival date & time 04/27/13  1302 History  This chart was scribed for Junius FinnerErin O'Malley, PA working with Celene KrasJon R Knapp, MD by Quintella ReichertMatthew Underwood, ED Scribe. This patient was seen in room WTR7/WTR7 and the patient's care was started at 1:51 PM.   Chief Complaint  Patient presents with  . Back Pain    The history is provided by the patient. No language interpreter was used.    HPI Comments: Charles Olsen is a 38 y.o. male with h/o HTN who presents to the Emergency Department complaining of one week of sudden-onset, progressively-worsening right lower back pain.  Pt states that one week ago he was playing softball and when he reached out to catch the ball he felt a "catch" in his right lower back.  Since then he has continued to have aching and sharp pain in his right lower back.  He rates pain at a severity of "ten and a half" out of 10 at its worst, but 4/10 at rest.  Pain does not radiate into leg.  He has attempted to treat pain with ibuprofen, heating pad, ice, and a warm bath, without relief.  He states he has to sit in a certain way in order to relieve his pain.  He denies bowel or bladder dysfunction or weakness in arms or legs.  He denies upper back pain.  He denies h/o back surgery or cancer.  Pt walks a lot at work but denies heavy lifting.  He is allergic to oxycodone.  He has been on muscle relaxants in the past, which worked well for him without adverse reaction.    Past Medical History  Diagnosis Date  . Hypertension     Past Surgical History  Procedure Laterality Date  . Finger surgery Left     middle finger    Family History  Problem Relation Age of Onset  . Alzheimer's disease Maternal Grandmother   . Cancer Paternal Grandmother     History  Substance Use Topics  . Smoking status: Never Smoker   . Smokeless tobacco: Never Used  . Alcohol Use: Yes     Comment: occasionally     Review of Systems  Gastrointestinal: Negative for diarrhea.   Genitourinary: Negative for enuresis.  Musculoskeletal: Positive for back pain.  Neurological: Negative for weakness and numbness.  All other systems reviewed and are negative.      Allergies  Oxycodone  Home Medications   Current Outpatient Rx  Name  Route  Sig  Dispense  Refill  . acetaminophen (TYLENOL) 325 MG tablet   Oral   Take 650 mg by mouth every 6 (six) hours as needed (pain).         . diazepam (VALIUM) 5 MG tablet   Oral   Take 1 tablet (5 mg total) by mouth 2 (two) times daily.   10 tablet   0   . ibuprofen (ADVIL,MOTRIN) 600 MG tablet   Oral   Take 1 tablet (600 mg total) by mouth every 6 (six) hours as needed.   30 tablet   0    BP 132/85  Pulse 95  Temp(Src) 97.9 F (36.6 C) (Oral)  Resp 20  SpO2 96%  Physical Exam  Nursing note and vitals reviewed. Constitutional: He is oriented to person, place, and time. He appears well-developed and well-nourished.  HENT:  Head: Normocephalic and atraumatic.  Eyes: EOM are normal.  Neck: Normal range of motion.  Cardiovascular: Normal rate.  Pulmonary/Chest: Effort normal.  Musculoskeletal: Normal range of motion. He exhibits tenderness.       Lumbar back: He exhibits tenderness.  Tenderness of right lower lumbar musculature. No bony tenderness to spine or hip.  Increased pain with flexion at the hip.  Neurological: He is alert and oriented to person, place, and time.  Antalgic gait.  Skin: Skin is warm and dry.  Psychiatric: He has a normal mood and affect. His behavior is normal.    ED Course  Procedures (including critical care time)  DIAGNOSTIC STUDIES: Oxygen Saturation is 96% on room air, normal by my interpretation.    COORDINATION OF CARE: 1:57 PM-Discussed treatment plan which includes muscle relaxants with pt at bedside and pt agreed to plan.     Labs Review Labs Reviewed - No data to display  Imaging Review No results found.  EKG Interpretation   None       MDM    Final diagnoses:  Lower back injury  Lower back pain    pt c/o right lower back pain after playing softball 1 week ago. No red flag symptoms. On exam, pt is tender along right lumbar musculature. No bony tenderness. Pt has antalgic gait, increased pain with flexion at the hip.  Do not believe imaging would be of benefit at this time. Not concerned for emergent process taking place at this time.  Will tx symptomatically. Rx: valium and ibuprofen. Advised pt to refrain from heavy lifting or strenuous activity for next 1-2 weeks. Advised to f/u with PCP as needed for continued back pain. Alternate ice and heat. Return precautions provided. Pt verbalized understanding and agreement with tx plan.   I personally performed the services described in this documentation, which was scribed in my presence. The recorded information has been reviewed and is accurate.    Junius Finner, PA-C 04/28/13 1143

## 2013-05-02 NOTE — ED Provider Notes (Signed)
Medical screening examination/treatment/procedure(s) were performed by non-physician practitioner and as supervising physician I was immediately available for consultation/collaboration.  Celene KrasJon R Shavonne Ambroise, MD 05/02/13 [phone]

## 2016-11-12 ENCOUNTER — Encounter (HOSPITAL_COMMUNITY): Payer: Self-pay

## 2016-11-12 ENCOUNTER — Emergency Department (HOSPITAL_COMMUNITY): Payer: 59

## 2016-11-12 ENCOUNTER — Emergency Department (HOSPITAL_COMMUNITY)
Admission: EM | Admit: 2016-11-12 | Discharge: 2016-11-12 | Disposition: A | Discharge status: Home / Self Care | Payer: 59 | Attending: Emergency Medicine | Admitting: Emergency Medicine

## 2016-11-12 DIAGNOSIS — R109 Unspecified abdominal pain: Secondary | ICD-10-CM | POA: Insufficient documentation

## 2016-11-12 DIAGNOSIS — Z79899 Other long term (current) drug therapy: Secondary | ICD-10-CM | POA: Diagnosis not present

## 2016-11-12 DIAGNOSIS — M545 Low back pain, unspecified: Secondary | ICD-10-CM

## 2016-11-12 LAB — BASIC METABOLIC PANEL
ANION GAP: 6 (ref 5–15)
BUN: 19 mg/dL (ref 6–20)
CALCIUM: 9.3 mg/dL (ref 8.9–10.3)
CO2: 26 mmol/L (ref 22–32)
Chloride: 105 mmol/L (ref 101–111)
Creatinine, Ser: 0.9 mg/dL (ref 0.61–1.24)
GFR calc Af Amer: >60 mL/min (ref >60)
Glucose, Bld: 101 mg/dL — ABNORMAL HIGH (ref 65–99)
POTASSIUM: 3.9 mmol/L (ref 3.5–5.1)
SODIUM: 137 mmol/L (ref 135–145)

## 2016-11-12 LAB — CBC WITH DIFFERENTIAL/PLATELET
Basophils Absolute: 0 10*3/uL (ref 0.0–0.1)
Basophils Relative: 0 %
EOS ABS: 0.2 10*3/uL (ref 0.0–0.7)
EOS PCT: 2 %
HCT: 43.2 % (ref 39.0–52.0)
Hemoglobin: 15.3 g/dL (ref 13.0–17.0)
LYMPHS ABS: 2.6 10*3/uL (ref 0.7–4.0)
Lymphocytes Relative: 29 %
MCH: 30.2 pg (ref 26.0–34.0)
MCHC: 35.4 g/dL (ref 30.0–36.0)
MCV: 85.2 fL (ref 78.0–100.0)
Monocytes Absolute: 0.6 10*3/uL (ref 0.1–1.0)
Monocytes Relative: 6 %
Neutro Abs: 5.6 10*3/uL (ref 1.7–7.7)
Neutrophils Relative %: 63 %
PLATELETS: 231 10*3/uL (ref 150–400)
RBC: 5.07 MIL/uL (ref 4.22–5.81)
RDW: 12.6 % (ref 11.5–15.5)
WBC: 8.9 10*3/uL (ref 4.0–10.5)

## 2016-11-12 LAB — URINALYSIS, ROUTINE W REFLEX MICROSCOPIC
BACTERIA UA: NONE SEEN
Bilirubin Urine: NEGATIVE
Glucose, UA: NEGATIVE mg/dL
Ketones, ur: NEGATIVE mg/dL
Leukocytes, UA: NEGATIVE
Nitrite: NEGATIVE
PROTEIN: NEGATIVE mg/dL
Specific Gravity, Urine: 1.013 (ref 1.005–1.030)
Squamous Epithelial / LPF: NONE SEEN
pH: 5 (ref 5.0–8.0)

## 2016-11-12 MED ORDER — NAPROXEN 375 MG PO TABS: 375.0000 mg | ORAL_TABLET | Freq: Two times a day (BID) | ORAL | 0 refills | Status: DC

## 2016-11-12 MED ORDER — KETOROLAC TROMETHAMINE 30 MG/ML IJ SOLN
30.0000 mg | Freq: Once | INTRAMUSCULAR | Status: AC
  Administered 2016-11-12: 30 mg via INTRAMUSCULAR
  Filled 2016-11-12: qty 1

## 2016-11-12 MED ORDER — CYCLOBENZAPRINE HCL 10 MG PO TABS: 10.0000 mg | ORAL_TABLET | Freq: Two times a day (BID) | ORAL | 0 refills | Status: DC | PRN

## 2016-11-12 NOTE — Discharge Instructions (Signed)
Workup has been normal. Please take medications as prescribed and instructed. ° °Please take the Naproxen as prescribed for pain. Do not take any additional NSAIDs including Motrin, Aleve, Ibuprofen, Advil. ° °Please the the Flexeril for muscle relaxation. This medication will make you drowsy so avoid situation that could place you in danger.  ° ° °SEEK IMMEDIATE MEDICAL ATTENTION IF: °New numbness, tingling, weakness, or problem with the use of your arms or legs.  °Severe back pain not relieved with medications.  °Change in bowel or bladder control.  °Urinary retention.  °Numbness in your groin.  °Increasing pain in any areas of the body (such as chest or abdominal pain).  °Shortness of breath, dizziness or fainting.  °Nausea (feeling sick to your stomach), vomiting, fever, or sweats. ° ° °

## 2016-11-12 NOTE — ED Triage Notes (Signed)
Patient c/o right lower back pain that worsens with movement at times x 1 week.

## 2016-11-12 NOTE — ED Provider Notes (Signed)
[facility]-EMERGENCY DEPT Provider Note   CSN: [id number] Arrival date & time: 11/12/16  0718     History   Chief Complaint Chief Complaint  Patient presents with  . Back Pain    HPI Charles Olsen is a 41 y.o. male.  HPI 41 year old Caucasian male past medical history significant for hypertension presents to the emergency department today with complaints of left lowerback pain. 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 States that the pain has been ongoing for 1 week. It is intermittent. Worse with movement and palpation. Unrelieved by ibuprofen at home. The patient states that he occasionally gets pain that wraps around to his left lower quadrant. Describes the pain as sharp in nature. States that he was seen by his PCP last week where he had urine performed. States that he did have some blood in his urine at that time. They said may be related to what he was drinking versus possible kidney stone. Patient denies any urinary symptoms. He denies any nausea or vomiting. Denies any fevers. Denies any testicular pain or swelling.  Pt denies any ha, night sweats, hx of ivdu/cancer, loss or bowel or bladder, urinary retention, saddle paresthesias, lower extremity paresthesias.   Past Medical History:  Diagnosis Date  . Hypertension     Patient Active Problem List   Diagnosis Date Noted  . Bell's palsy 01/18/2013    Past Surgical History:  Procedure Laterality Date  . FINGER SURGERY Left    middle finger       Home Medications    Prior to Admission medications   Medication Sig Start Date End Date Taking? Authorizing Provider  ibuprofen (ADVIL,MOTRIN) 200 MG tablet Take 400 mg by mouth every 6 (six) hours as needed for mild pain.   Yes [provider]  cyclobenzaprine (FLEXERIL) 10 MG tablet Take 1 tablet (10 mg total) by mouth 2 (two) times daily as needed for muscle spasms. 11/12/16   Rise Mu, PA-C  naproxen (NAPROSYN) 375 MG  tablet Take 1 tablet (375 mg total) by mouth 2 (two) times daily. 11/12/16   Rise Mu, PA-C    Family History Family History  Problem Relation Age of Onset  . Alzheimer's disease Maternal Grandmother   . Cancer Paternal Grandmother     Social History Social History  Substance Use Topics  . Smoking status: Never Smoker  . Smokeless tobacco: Never Used  . Alcohol use No     Allergies   Oxycodone   Review of Systems Review of Systems  Constitutional: Negative for chills and fever.  Gastrointestinal: Positive for abdominal pain. Negative for diarrhea, nausea and vomiting.  Genitourinary: Negative for dysuria, flank pain, frequency, hematuria, scrotal swelling, testicular pain and urgency.  Musculoskeletal: Positive for arthralgias and back pain.  Skin: Negative for rash.  Neurological: Negative for weakness and headaches.  Psychiatric/Behavioral: Negative for sleep disturbance.     Physical Exam Updated Vital Signs BP 134/86 (BP Location: Left Arm)   Pulse 88   Temp 97.8 F (36.6 C) (Oral)   Resp 18   Ht 6\' 3"  (1.905 m)   Wt (!) 144.7 kg (319 lb)   SpO2 97%   BMI 39.87 kg/m   Physical Exam  Constitutional: He is oriented to person, place, and time. He appears well-developed and well-nourished.  Non-toxic appearance. No distress.  HENT:  Head: Normocephalic and atraumatic.  Mouth/Throat: Oropharynx is clear and moist.  Eyes: Pupils are equal, round, and reactive  to light. Conjunctivae are normal. Right eye exhibits no discharge. Left eye exhibits no discharge.  Neck: Normal range of motion. Neck supple.  No c spine midline tenderness. No paraspinal tenderness. No deformities or step offs noted. Full ROM. Supple. No nuchal rigidity.    Cardiovascular: Normal rate, regular rhythm, normal heart sounds and intact distal pulses.  Exam reveals no gallop and no friction rub.   No murmur heard. Pulmonary/Chest: Effort normal and breath sounds normal. No  respiratory distress. He exhibits no tenderness.  Abdominal: Soft. Bowel sounds are normal. He exhibits no distension. There is no tenderness. There is no rigidity, no rebound, no guarding, no CVA tenderness, no tenderness at McBurney's point and negative Murphy's sign.  Musculoskeletal: Normal range of motion. He exhibits no tenderness.  No midline T spine or L spine tenderness. No deformities or step offs noted. Full ROM. Pelvis is stable.  Left paraspinal lumbar tenderness to palpation with tense musculature noted.  Negative straight leg raise test.  Lymphadenopathy:    He has no cervical adenopathy.  Neurological: He is alert and oriented to person, place, and time.  Strength bilateral lower extremities. Patellar reflexes normal. Sensation intact.  Skin: Skin is warm and dry. Capillary refill takes less than 2 seconds. No rash noted.  Psychiatric: His behavior is normal. Judgment and thought content normal.  Nursing note and vitals reviewed.    ED Treatments / Results  Labs (all labs ordered are listed, but only abnormal results are displayed) Labs Reviewed  URINALYSIS, ROUTINE W REFLEX MICROSCOPIC - Abnormal; Notable for the following:       Result Value   Color, Urine STRAW (*)    Hgb urine dipstick SMALL (*)    All other components within normal limits  BASIC METABOLIC PANEL - Abnormal; Notable for the following:    Glucose, Bld 101 (*)    All other components within normal limits  CBC WITH DIFFERENTIAL/PLATELET    EKG  EKG Interpretation None       Radiology Ct Renal Stone Study  Result Date: 11/12/2016 CLINICAL DATA:  Right flank pain, low back pain EXAM: CT ABDOMEN AND PELVIS WITHOUT CONTRAST TECHNIQUE: Multidetector CT imaging of the abdomen and pelvis was performed following the standard protocol without IV contrast. COMPARISON:  None. FINDINGS: Lower chest: Lung bases are clear. No effusions. Heart is normal size. Hepatobiliary: No focal hepatic abnormality.  Gallbladder unremarkable. Pancreas: No focal abnormality or ductal dilatation. Spleen: No focal abnormality.  Normal size. Adrenals/Urinary Tract: No adrenal abnormality. No focal renal abnormality. No stones or hydronephrosis. Urinary bladder is unremarkable. Stomach/Bowel: Normal appendix. Stomach, large and small bowel grossly unremarkable. Vascular/Lymphatic: No evidence of aneurysm or adenopathy. Reproductive: No visible focal abnormality. Other: No free fluid or free air. Small left inguinal hernia containing fat. Musculoskeletal: No acute bony abnormality. IMPRESSION: No renal or ureteral stones.  No hydronephrosis.  No acute findings. Normal appendix. Small left inguinal hernia containing fat. Electronically Signed   By: Charlett Nose M.D.   On: 11/12/2016 09:16    Procedures Procedures (including critical care time)  Medications Ordered in ED Medications  ketorolac (TORADOL) 30 MG/ML injection 30 mg (not administered)     Initial Impression / Assessment and Plan / ED Course  I have reviewed the triage vital signs and the nursing notes.  Pertinent labs & imaging results that were available during my care of the patient were reviewed by me and considered in my medical decision making (see chart for details).  Patient with back pain. Patient reports hematuria last week. Denies any other urinary symptoms. Patient denies any associated fever, nausea, vomiting.  Lab work was unremarkable. UA shows no signs of infection. CT scan obtained showed no ureteral stones. No neurological deficits and normal neuro exam.  Patient can walk but states is painful.  No loss of bowel or bladder control.  No concern for cauda equina.  No fever, night sweats, weight loss, h/o cancer, IVDU. Symptoms seem consistent with muscular skeletal strain. RICE protocol and pain medicine indicated and discussed with patient.    Final Clinical Impressions(s) / ED Diagnoses   Final diagnoses:  Acute left-sided low  back pain without sciatica    New Prescriptions New Prescriptions   CYCLOBENZAPRINE (FLEXERIL) 10 MG TABLET    Take 1 tablet (10 mg total) by mouth 2 (two) times daily as needed for muscle spasms.   NAPROXEN (NAPROSYN) 375 MG TABLET    Take 1 tablet (375 mg total) by mouth 2 (two) times daily.     Rise MuLeaphart, Anastyn Ayars T, PA-C 11/12/16 1054    Rise MuLeaphart, Shatika Grinnell T, PA-C 11/12/16 1054    Shaune PollackIsaacs, [place], MD 11/14/16 1120

## 2017-07-23 ENCOUNTER — Encounter (HOSPITAL_COMMUNITY): Payer: Self-pay | Admitting: Emergency Medicine

## 2017-07-23 ENCOUNTER — Emergency Department (HOSPITAL_COMMUNITY)
Admission: EM | Admit: 2017-07-23 | Discharge: 2017-07-23 | Disposition: A | Discharge status: Home / Self Care | Payer: 59 | Attending: Emergency Medicine | Admitting: Emergency Medicine

## 2017-07-23 DIAGNOSIS — L089 Local infection of the skin and subcutaneous tissue, unspecified: Secondary | ICD-10-CM | POA: Insufficient documentation

## 2017-07-23 DIAGNOSIS — I10 Essential (primary) hypertension: Secondary | ICD-10-CM | POA: Diagnosis not present

## 2017-07-23 DIAGNOSIS — Z79899 Other long term (current) drug therapy: Secondary | ICD-10-CM | POA: Insufficient documentation

## 2017-07-23 MED ORDER — DOXYCYCLINE HYCLATE 100 MG PO CAPS
100.0000 mg | ORAL_CAPSULE | Freq: Two times a day (BID) | ORAL | 0 refills | Status: DC
Start: 2017-07-23 — End: 2017-07-31

## 2017-07-23 NOTE — ED Triage Notes (Signed)
Pt c/o raised, redden area on left shoulder that is painful for couple days. Reports put warm compress on it and did have pus drainage to come out.

## 2017-07-23 NOTE — ED Provider Notes (Signed)
[facility]-EMERGENCY DEPT Provider Note   CSN: [id number] Arrival date & time: 07/23/17  1152     History   Chief Complaint Chief Complaint  Patient presents with  . Abscess    HPI Charles Olsen is a 42 y.o. male who presents to the ED for an infected area to the left shoulder. Patient reports he noted the area 2 days ago when he was taking off his shirt and felt pain. He applied warm wet compresses to the area and it drained yellow drainage. The redness has spread and patient concerned about infection.   HPI  Past Medical History:  Diagnosis Date  . Hypertension     Patient Active Problem List   Diagnosis Date Noted  . Bell's palsy 01/18/2013    Past Surgical History:  Procedure Laterality Date  . FINGER SURGERY Left    middle finger        Home Medications    Prior to Admission medications   Medication Sig Start Date End Date Taking? Authorizing Provider  cyclobenzaprine (FLEXERIL) 10 MG tablet Take 1 tablet (10 mg total) by mouth 2 (two) times daily as needed for muscle spasms. 11/12/16   Rise Mu, PA-C  doxycycline (VIBRAMYCIN) 100 MG capsule Take 1 capsule (100 mg total) by mouth 2 (two) times daily. 07/23/17   Janne Napoleon, NP  ibuprofen (ADVIL,MOTRIN) 200 MG tablet Take 400 mg by mouth every 6 (six) hours as needed for mild pain.    [provider]    Family History Family History  Problem Relation Age of Onset  . Alzheimer's disease Maternal Grandmother   . Cancer Paternal Grandmother     Social History Social History   Tobacco Use  . Smoking status: Never Smoker  . Smokeless tobacco: Never Used  Substance Use Topics  . Alcohol use: No  . Drug use: No     Allergies   Oxycodone   Review of Systems Review of Systems  Constitutional: Negative for chills and fever.  Skin: Positive for wound.  All other systems reviewed and are negative.    Physical Exam Updated Vital Signs BP (!) 137/100    Pulse 91   Temp 98.9 F (37.2 C) (Oral)   Resp 18   Ht  (1.93 m)   Wt (!) 147.4 kg (325 lb)   SpO2 100%   BMI 39.56 kg/m   Physical Exam  Constitutional: He appears well-developed and well-nourished. No distress.  HENT:  Head: Normocephalic.  Eyes: EOM are normal.  Neck: Normal range of motion. Neck supple.  Cardiovascular: Normal rate.  Pulmonary/Chest: Effort normal.  Musculoskeletal: Normal range of motion.       Arms: There is a 2 cm area of erythema noted on the left shoulder with a pustular center that has been draining. There is no red streaking. Area appears as an infected insect bite.  Neurological: He is alert.  Skin: Skin is warm and dry.  Psychiatric: He has a normal mood and affect.  Nursing note and vitals reviewed.    ED Treatments / Results  Labs (all labs ordered are listed, but only abnormal results are displayed) Labs Reviewed - No data to display  Procedures Procedures (including critical care time)  Medications Ordered in ED Medications - No data to display   Initial Impression / Assessment and Plan / ED Course  I have reviewed the triage vital signs and the nursing notes. 42 y.o. male with infected area to the  left shoulder stable for d/c without fever and does not appear toxic, no red streaking from the area. Will treat with antibiotics and patient will apply warm wet compresses and take tylenol as needed. Discussed elevated BP and need for follow up.   Final Clinical Impressions(s) / ED Diagnoses   Final diagnoses:  Skin infection    ED Discharge Orders        Ordered    doxycycline (VIBRAMYCIN) 100 MG capsule  2 times daily     07/23/17 1225       Damian Leavell [place], [place] 07/23/17 1349    Vanetta Mulders, MD 07/26/17 [phone]

## 2017-07-23 NOTE — Discharge Instructions (Addendum)
Take tylenol and ibuprofen in addition to the antibiotic. Apply warm wet compresses to the area. Follow up with your doctor or return here for worsening symptoms.  You also need to follow up for your elevated blood pressure.

## 2017-07-23 NOTE — ED Notes (Signed)
Bed: WTR8 Expected date:  Expected time:  Means of arrival:  Comments: 

## 2017-07-23 NOTE — ED Notes (Signed)
RN reviewed discharge paperwork and encouraged pt to call the number to get established with a primary care doctor and to recheck his blood pressure.

## 2017-07-31 ENCOUNTER — Emergency Department (HOSPITAL_COMMUNITY)
Admission: EM | Admit: 2017-07-31 | Discharge: 2017-07-31 | Disposition: A | Discharge status: Home / Self Care | Payer: 59 | Attending: Emergency Medicine | Admitting: Emergency Medicine

## 2017-07-31 ENCOUNTER — Encounter (HOSPITAL_COMMUNITY): Payer: Self-pay | Admitting: Emergency Medicine

## 2017-07-31 ENCOUNTER — Other Ambulatory Visit: Payer: Self-pay

## 2017-07-31 DIAGNOSIS — L0291 Cutaneous abscess, unspecified: Secondary | ICD-10-CM

## 2017-07-31 DIAGNOSIS — L02414 Cutaneous abscess of left upper limb: Secondary | ICD-10-CM | POA: Diagnosis not present

## 2017-07-31 DIAGNOSIS — B353 Tinea pedis: Secondary | ICD-10-CM

## 2017-07-31 MED ORDER — LIDOCAINE-EPINEPHRINE (PF) 2 %-1:200000 IJ SOLN
20.0000 mL | Freq: Once | INTRAMUSCULAR | Status: AC
  Administered 2017-07-31: 20 mL
  Filled 2017-07-31: qty 20

## 2017-07-31 MED ORDER — KETOCONAZOLE 2 % EX SHAM: 1.0000 "application " | MEDICATED_SHAMPOO | CUTANEOUS | 0 refills | Status: DC

## 2017-07-31 MED ORDER — DOXYCYCLINE HYCLATE 100 MG PO CAPS: 100.0000 mg | ORAL_CAPSULE | Freq: Two times a day (BID) | ORAL | 0 refills | Status: DC

## 2017-07-31 NOTE — ED Provider Notes (Signed)
[facility]-EMERGENCY DEPT Provider Note   CSN: [id number] Arrival date & time: 07/31/17  1125     History   Chief Complaint Chief Complaint  Patient presents with  . Insect Bite    HPI Charles Olsen is a 42 y.o. male here for persistent drainage of pus from suspected insect bite to left shoulder. Came to ER one week ago and diagnosed with abscess and prescribed doxycyline which he has been compliant with. Has been performing using tylenol, warm compresses and massaging under warm water. Initially abscess was the size of a baseball now smaller however still draining pus. Denies fevers, chills, pain with shoulder ROM, IVDU, h/o septic arthritis.   Also reports rash to left mid foot since starting doxycyline, multiple red itchy spots, non tender. No h/o DM.   HPI  Past Medical History:  Diagnosis Date  . Hypertension     Patient Active Problem List   Diagnosis Date Noted  . Bell's palsy 01/18/2013    Past Surgical History:  Procedure Laterality Date  . FINGER SURGERY Left    middle finger        Home Medications    Prior to Admission medications   Medication Sig Start Date End Date Taking? Authorizing Provider  ibuprofen (ADVIL,MOTRIN) 200 MG tablet Take 400 mg by mouth every 6 (six) hours as needed for mild pain.   Yes [provider]  cyclobenzaprine (FLEXERIL) 10 MG tablet Take 1 tablet (10 mg total) by mouth 2 (two) times daily as needed for muscle spasms. Patient not taking: Reported on 07/31/2017 11/12/16   Demetrios Loll T, PA-C  doxycycline (VIBRAMYCIN) 100 MG capsule Take 1 capsule (100 mg total) by mouth 2 (two) times daily for 3 days. 07/31/17 08/03/17  Liberty Handy, PA-C  ketoconazole (NIZORAL) 2 % shampoo Apply 1 application topically 2 (two) times a week. 07/31/17   Liberty Handy, PA-C    Family History Family History  Problem Relation Age of Onset  . Alzheimer's disease Maternal Grandmother   . Cancer  Paternal Grandmother     Social History Social History   Tobacco Use  . Smoking status: Never Smoker  . Smokeless tobacco: Never Used  Substance Use Topics  . Alcohol use: No  . Drug use: No     Allergies   Mushroom extract complex and Oxycodone   Review of Systems Review of Systems  Skin: Positive for wound.       abscess  All other systems reviewed and are negative.    Physical Exam Updated Vital Signs BP 138/88   Pulse 85   Temp 98.3 F (36.8 C) (Oral)   Resp 16   Ht  (1.93 m)   Wt (!) 147.4 kg (325 lb)   SpO2 98%   BMI 39.56 kg/m   Physical Exam  Constitutional: He is oriented to person, place, and time. He appears well-developed and well-nourished. No distress.  NAD.  HENT:  Head: Normocephalic and atraumatic.  Eyes: Conjunctivae and EOM are normal. No scleral icterus.  Cardiovascular: Normal rate, regular rhythm and normal heart sounds.  2+ radial pulses bilaterally  Pulmonary/Chest: Effort normal and breath sounds normal.  Musculoskeletal: Normal range of motion. He exhibits no deformity.  No focal bony tenderness to left shoulder bony prominences. Full painless AROM of left shoulder.   Neurological: He is alert and oriented to person, place, and time.  Sensation to pinch intact in bilateral upper extremities  Skin: Skin is warm and  dry. Capillary refill takes less than 2 seconds.  3 x 5 cm focal area of erythema, edema, warmth to top of left shoulder with central scab.  Multiple circular erythematous scaling patch with lighter central clearing to dorsal aspect of midfoot (left).  Psychiatric: He has a normal mood and affect. His behavior is normal. Judgment and thought content normal.  Nursing note and vitals reviewed.    ED Treatments / Results  Labs (all labs ordered are listed, but only abnormal results are displayed) Labs Reviewed - No data to display  EKG None  Radiology No results found.  Procedures .Marland KitchenIncision and  Drainage Date/Time: 07/31/2017 9:53 PM Performed by: Liberty Handy, PA-C Authorized by: Liberty Handy, PA-C   Consent:    Consent obtained:  Verbal   Consent given by:  Patient   Risks discussed:  Bleeding, incomplete drainage, pain and infection (worsening infection, septic arthritis, osteomyelitis ) Location:    Type:  Abscess   Size:  3 x 5   Location:  Upper extremity   Upper extremity location:  Shoulder   Shoulder location:  L shoulder Pre-procedure details:    Procedure prep: alcohol wipe. Anesthesia (see MAR for exact dosages):    Anesthesia method:  Local infiltration   Local anesthetic:  Lidocaine 2% WITH epi Procedure type:    Complexity:  Simple Procedure details:    Incision types:  Single straight   Scalpel blade:  11   Wound management:  Probed and deloculated   Drainage:  Purulent   Drainage amount:  Moderate   Wound treatment:  Wound left open   Packing materials:  None Post-procedure details:    Patient tolerance of procedure:  Tolerated well, no immediate complications   (including critical care time)  Medications Ordered in ED Medications  lidocaine-EPINEPHrine (XYLOCAINE W/EPI) 2 %-1:200000 (PF) injection 20 mL (20 mLs Infiltration Given by Other 07/31/17 1357)     Initial Impression / Assessment and Plan / ED Course  I have reviewed the triage vital signs and the nursing notes.  Pertinent labs & imaging results that were available during my care of the patient were reviewed by me and considered in my medical decision making (see chart for details).     42 y.o. yo male with abscess to left shoulder. Appropriate local cellulitis.  Doubt joint involvement, septic arthritis, osteomyelitis as size, drainage has improved. There is no focal bony tenderness or pain with AROM. No associated fever. I&D performed in ED with adequate evacuation of purulent discharge. Will extend doxycycline for three additional days.  I don't think pt has failed oral  abx I think may just need prolonged coverage, with small incision made today drainage was facilitated this likely will help with complete healing. Will discharge with warm compresses, NSAIDs and I&D care instructions. Patient aware of symptoms that would warrant return to ED for re-evaluation and treatment. Patient verbalized understanding and is agreeable with plan.Pt has rash consistent with tinea to left foot, will rx clotrimazole.  Final Clinical Impressions(s) / ED Diagnoses   Final diagnoses:  Abscess  Tinea pedis of left foot    ED Discharge Orders        Ordered    doxycycline (VIBRAMYCIN) 100 MG capsule  2 times daily     07/31/17 1508    ketoconazole (NIZORAL) 2 % shampoo  2 times weekly     07/31/17 1519       Liberty Handy, PA-C 07/31/17 2205    Rolan Bucco,  MD 08/01/17 4098

## 2017-07-31 NOTE — ED Triage Notes (Addendum)
Patient c/o insect bite to left shoulder x1 week ago. Reports completing abx as prescribed but continues to have white drainage from site with red streak to left axilla.

## 2017-07-31 NOTE — Discharge Instructions (Addendum)
Redness today is 3 x 5 cm.  It seems to be getting smaller than a week ago.  Abscess was drained. Perform warm compresses as often as possible with massage. Take tylenol and ibuprofen for swelling and redness.   Return to ER in 3 days for re-evaluation.  There is always a risk for an abscess to spread deep into muscle and bone, monitor for signs of worsening infection including increased redness, warmth, fevers, pain with shoulder movement.  Apply shampoo to rash on foot before shower, let dry and rinse. Redness may take several weeks to go away and may return. Keep feet dry. If unable to afford shampoo you can purchase selsium blue shampoo over the counter and use similarly.

## 2018-01-19 ENCOUNTER — Encounter (HOSPITAL_COMMUNITY): Payer: Self-pay | Admitting: Emergency Medicine

## 2018-01-19 ENCOUNTER — Emergency Department (HOSPITAL_COMMUNITY): Payer: 59

## 2018-01-19 ENCOUNTER — Emergency Department (HOSPITAL_COMMUNITY)
Admission: EM | Admit: 2018-01-19 | Discharge: 2018-01-19 | Disposition: A | Discharge status: Home / Self Care | Payer: 59 | Attending: Emergency Medicine | Admitting: Emergency Medicine

## 2018-01-19 DIAGNOSIS — R0789 Other chest pain: Secondary | ICD-10-CM | POA: Insufficient documentation

## 2018-01-19 DIAGNOSIS — I10 Essential (primary) hypertension: Secondary | ICD-10-CM | POA: Diagnosis not present

## 2018-01-19 DIAGNOSIS — R079 Chest pain, unspecified: Secondary | ICD-10-CM | POA: Diagnosis not present

## 2018-01-19 LAB — CBC
HCT: 44.9 % (ref 39.0–52.0)
HEMOGLOBIN: 15.2 g/dL (ref 13.0–17.0)
MCH: 30 pg (ref 26.0–34.0)
MCHC: 33.9 g/dL (ref 30.0–36.0)
MCV: 88.6 fL (ref 80.0–100.0)
Platelets: 246 10*3/uL (ref 150–400)
RBC: 5.07 MIL/uL (ref 4.22–5.81)
RDW: 12 % (ref 11.5–15.5)
WBC: 8.7 10*3/uL (ref 4.0–10.5)
nRBC: 0 % (ref 0.0–0.2)

## 2018-01-19 LAB — BASIC METABOLIC PANEL
Anion gap: 7 (ref 5–15)
BUN: 10 mg/dL (ref 6–20)
CALCIUM: 9.2 mg/dL (ref 8.9–10.3)
CO2: 28 mmol/L (ref 22–32)
Chloride: 103 mmol/L (ref 98–111)
Creatinine, Ser: 0.93 mg/dL (ref 0.61–1.24)
GFR calc Af Amer: >60 mL/min (ref >60)
GLUCOSE: 148 mg/dL — AB (ref 70–99)
Potassium: 3.7 mmol/L (ref 3.5–5.1)
Sodium: 138 mmol/L (ref 135–145)

## 2018-01-19 LAB — I-STAT TROPONIN, ED
TROPONIN I, POC: 0 ng/mL (ref 0.00–0.08)
TROPONIN I, POC: 0 ng/mL (ref 0.00–0.08)

## 2018-01-19 NOTE — Discharge Instructions (Signed)
Schedule follow up with primary care for recheck.  Your blood pressure was slightly elevated and will need follow up

## 2018-01-19 NOTE — ED Provider Notes (Signed)
[facility]-EMERGENCY DEPT Provider Note   CSN: [id number] Arrival date & time: 01/19/18  7829     History   Chief Complaint Chief Complaint  Patient presents with  . Chest Pain    HPI Charles Olsen is a 42 y.o. male.  The history is provided by the patient. No language interpreter was used.  Chest Pain   This is a new problem. The current episode started yesterday. The problem occurs constantly. The problem has been gradually worsening. The pain is present in the lateral region. The pain is moderate. The pain does not radiate. He has tried nothing for the symptoms. The treatment provided no relief. There are no known risk factors.  Pertinent negatives for past medical history include no CAD.   Pt reports he has been having pain in the right side of his chest since yesterday.  Pt thought pain was from lifting.  Pt reports he lifts heavy boxes at work.  Pt reports he drank a bottle of mag citrate and had a bowel movement.  Pt reports symptoms resolved.  Past Medical History:  Diagnosis Date  . Hypertension     Patient Active Problem List   Diagnosis Date Noted  . Bell's palsy 01/18/2013    Past Surgical History:  Procedure Laterality Date  . FINGER SURGERY Left    middle finger        Home Medications    Prior to Admission medications   Medication Sig Start Date End Date Taking? Authorizing Provider  acetaminophen (TYLENOL) 325 MG tablet Take 650 mg by mouth every 6 (six) hours as needed for mild pain or headache.   Yes [provider]  cyclobenzaprine (FLEXERIL) 10 MG tablet Take 1 tablet (10 mg total) by mouth 2 (two) times daily as needed for muscle spasms. Patient not taking: Reported on 07/31/2017 11/12/16   Demetrios Loll T, PA-C  ketoconazole (NIZORAL) 2 % shampoo Apply 1 application topically 2 (two) times a week. Patient not taking: Reported on 01/19/2018 07/31/17   Liberty Handy, PA-C    Family History Family History   Problem Relation Age of Onset  . Alzheimer's disease Maternal Grandmother   . Cancer Paternal Grandmother     Social History Social History   Tobacco Use  . Smoking status: Never Smoker  . Smokeless tobacco: Never Used  Substance Use Topics  . Alcohol use: No  . Drug use: No     Allergies   Mushroom extract complex and Oxycodone   Review of Systems Review of Systems  Cardiovascular: Positive for chest pain.  All other systems reviewed and are negative.    Physical Exam Updated Vital Signs BP (!) 149/92 (BP Location: Left Arm)   Pulse (!) 101   Temp 98.6 F (37 C) (Oral)   Resp 20   SpO2 100%   Physical Exam  Constitutional: He appears well-developed and well-nourished.  HENT:  Head: Normocephalic and atraumatic.  Eyes: Conjunctivae are normal.  Neck: Neck supple.  Cardiovascular: Normal rate, regular rhythm and normal pulses.  No murmur heard. Pulmonary/Chest: Effort normal. No respiratory distress. He has decreased breath sounds in the right upper field.  Abdominal: Soft. There is no tenderness.  Musculoskeletal: Normal range of motion. He exhibits no edema.       Right lower leg: Normal.       Left lower leg: Normal.  Neurological: He is alert.  Skin: Skin is warm and dry.  Psychiatric: He has a normal mood and  affect.  Nursing note and vitals reviewed.    ED Treatments / Results  Labs (all labs ordered are listed, but only abnormal results are displayed) Labs Reviewed  BASIC METABOLIC PANEL - Abnormal; Notable for the following components:      Result Value   Glucose, Bld 148 (*)    All other components within normal limits  CBC  I-STAT TROPONIN, ED  I-STAT TROPONIN, ED    EKG None  Radiology Dg Chest 2 View  Result Date: 01/19/2018 CLINICAL DATA:  Right-sided chest pain beginning yesterday. Symptoms radiate to the upper back and right shoulder. Some shortness of breath. EXAM: CHEST - 2 VIEW COMPARISON:  Chest x-ray of September 02, 2011  FINDINGS: The lungs are well-expanded. There is patchy increased density in the left lower lobe. There is no pleural effusion. The heart is top-normal in size. The pulmonary vascularity is not engorged. The bony thorax exhibits no acute abnormality. IMPRESSION: Coarse retrocardiac lung markings suggest atelectasis or developing pneumonia likely in the left lower lobe. No definite parenchymal abnormality on the right. Followup PA and lateral chest X-ray is recommended in 3-4 weeks following trial of antibiotic therapy to ensure resolution and exclude underlying malignancy. Electronically Signed   By: David  [place] M.D.   On: 01/19/2018 11:18    Procedures Procedures (including critical care time)  Medications Ordered in ED Medications - No data to display   Initial Impression / Assessment and Plan / ED Course  I have reviewed the triage vital signs and the nursing notes.  Pertinent labs & imaging results that were available during my care of the patient were reviewed by me and considered in my medical decision making (see chart for details).  Clinical Course as of Jan 19 1410  Mon Jan 19, 2018  1358 I-Stat Troponin, ED (not at [facility]) [LS]    Clinical Course User Index [LS] Elson Areas, PA-C    EKG and Chest xray no acute abnormality.  Troponin negative.  Pt's pain may be muscular.  Current pain is resolved.  Pt is advised to schedule follow up with primary care   Final Clinical Impressions(s) / ED Diagnoses   Final diagnoses:  Chest wall pain    ED Discharge Orders    None       Elson Areas, [place] 01/19/18 1418    Geoffery Lyons, MD 01/19/18 1550

## 2018-01-19 NOTE — ED Triage Notes (Signed)
Per pt, states right chest pain since yesterday-states it radiates to right upper back/shoulder-states he does a lot of heavy lifting at work-some SOB

## 2018-01-19 NOTE — ED Notes (Signed)
Right chest/shoulder pain since Thursday; some coughing. Also reports constipation; relieved by BM two hours ago post oral medication for such. Since right shoulder/chest pain decreased to 5/10.

## 2018-02-12 ENCOUNTER — Ambulatory Visit: Payer: 59 | Admitting: Family Medicine

## 2018-03-27 ENCOUNTER — Ambulatory Visit: Payer: 59 | Admitting: Family Medicine

## 2018-03-27 ENCOUNTER — Encounter: Payer: Self-pay | Admitting: Family Medicine

## 2018-03-27 VITALS — BP 150/64 | HR 115 | Temp 98.2°F | Wt 323.2 lb

## 2018-03-27 DIAGNOSIS — R059 Cough, unspecified: Secondary | ICD-10-CM

## 2018-03-27 DIAGNOSIS — R6889 Other general symptoms and signs: Secondary | ICD-10-CM

## 2018-03-27 DIAGNOSIS — R7309 Other abnormal glucose: Secondary | ICD-10-CM

## 2018-03-27 DIAGNOSIS — R5383 Other fatigue: Secondary | ICD-10-CM

## 2018-03-27 DIAGNOSIS — R05 Cough: Secondary | ICD-10-CM

## 2018-03-27 DIAGNOSIS — R0681 Apnea, not elsewhere classified: Secondary | ICD-10-CM

## 2018-03-27 DIAGNOSIS — R0683 Snoring: Secondary | ICD-10-CM

## 2018-03-27 DIAGNOSIS — Z13220 Encounter for screening for lipoid disorders: Secondary | ICD-10-CM

## 2018-03-27 LAB — POC INFLUENZA A&B (BINAX/QUICKVUE)
INFLUENZA A, POC: NEGATIVE
INFLUENZA B, POC: NEGATIVE

## 2018-03-27 MED ORDER — DOXYCYCLINE HYCLATE 100 MG PO CAPS: 100.0000 mg | ORAL_CAPSULE | Freq: Two times a day (BID) | ORAL | 0 refills | Status: AC

## 2018-03-27 MED ORDER — E-Z SPACER DEVI: 2 refills | Status: DC

## 2018-03-27 MED ORDER — ALBUTEROL SULFATE HFA 108 (90 BASE) MCG/ACT IN AERS: 2.0000 | INHALATION_SPRAY | Freq: Four times a day (QID) | RESPIRATORY_TRACT | 1 refills | Status: DC | PRN

## 2018-03-27 NOTE — Progress Notes (Signed)
Charles Olsen DOB: 16-Jul-1975 Encounter date: 03/27/2018  This isa 43 y.o. male who presents to establish care. Chief Complaint  Patient presents with  . New Patient (Initial Visit)    cough, x 1 week, chest and abdomen hurt from coughing, chest congestion, body aches, night sweats    History of present illness: Cough is not productive. Was initially. Feels like there is phlegm that needs to come up.   Headaches: gets them occasionally. Migraines require him to go to sleep. Less than 3 headaches per month. Migraines once or twice per year.   Blood pressure stays high. Todays reading was typical for him. Has been on treatment for blood pressure in past. Doesn't remember what this was. Initially was on medication when he had significant leg injury, but on follow up they stopped medication.   Has some arthritic pains - right elbow - hurts on top and bottom. Hard to straighten out arm completely. Bothering him over a year.   Broke middle finger on left hand when 17; had screws in hand.   Wakes after 5-6 hours of sleep; frequently sooner. Does snore; has been told that he stops breathing while sleeping in past.   Dad had blood disorder - poor healing.   Past Medical History:  Diagnosis Date  . Hypertension    Past Surgical History:  Procedure Laterality Date  . FINGER SURGERY Left    middle finger   Allergies  Allergen Reactions  . Mushroom Extract Complex Hives  . Oxycontin [Oxycodone Hcl]    Current Meds  Medication Sig  . acetaminophen (TYLENOL) 325 MG tablet Take 650 mg by mouth every 6 (six) hours as needed for mild pain or headache.   Social History   Tobacco Use  . Smoking status: Never Smoker  . Smokeless tobacco: Never Used  Substance Use Topics  . Alcohol use: No   Family History  Problem Relation Age of Onset  . Osteoarthritis Mother   . Dementia Mother 11  . Alcohol abuse Father   . Arthritis Father   . Diabetes Father   . Depression Father   . Heart  attack Father   . Heart disease Father   . High blood pressure Father   . High Cholesterol Father   . Kidney disease Father   . Stroke Father 60  . Alzheimer's disease Maternal Grandmother   . Cancer Paternal Grandmother      Review of Systems  Constitutional: Positive for chills and diaphoresis. Negative for fatigue and fever.  HENT: Negative for congestion, sinus pressure and sinus pain.   Respiratory: Positive for cough, shortness of breath and wheezing. Negative for chest tightness.   Cardiovascular: Positive for chest pain (with cough). Negative for palpitations and leg swelling.  Musculoskeletal: Positive for arthralgias (see HPI).    Objective:  BP (!) 150/64 (BP Location: Left Arm, Patient Position: Sitting, Cuff Size: Normal)   Pulse (!) 115   Temp 98.2 F (36.8 C) (Oral)   Wt (!) 323 lb 3.2 oz (146.6 kg)   SpO2 97%   BMI 39.34 kg/m   Weight: (!) 323 lb 3.2 oz (146.6 kg)   BP Readings from Last 3 Encounters:  03/27/18 (!) 150/64  01/19/18 (!) 141/86  07/31/17 138/88   Wt Readings from Last 3 Encounters:  03/27/18 (!) 323 lb 3.2 oz (146.6 kg)  07/31/17 (!) 325 lb (147.4 kg)  07/23/17 (!) 325 lb (147.4 kg)    Physical Exam Constitutional:      Appearance:  He is well-developed. He is ill-appearing. He is not toxic-appearing.  HENT:     Right Ear: Tympanic membrane, ear canal and external ear normal.     Left Ear: Tympanic membrane, ear canal and external ear normal.     Nose: Mucosal edema present.     Right Sinus: No maxillary sinus tenderness or frontal sinus tenderness.     Left Sinus: No maxillary sinus tenderness or frontal sinus tenderness.     Mouth/Throat:     Pharynx: Uvula midline.  Cardiovascular:     Rate and Rhythm: Normal rate and regular rhythm.     Heart sounds: Normal heart sounds.  Pulmonary:     Effort: Pulmonary effort is normal.     Breath sounds: Decreased air movement present. Examination of the right-lower field reveals decreased  breath sounds. Examination of the left-lower field reveals decreased breath sounds. Decreased breath sounds and wheezing (end exp) present. No rhonchi.     Comments: Coarse lung sounds; diminished at bases    Assessment/Plan: 1. Snoring Long standing problem - Ambulatory referral to Sleep Studies  2. Other fatigue - Ambulatory referral to Sleep Studies  3. Apnea - Ambulatory referral to Sleep Studies  4. Flu-like symptoms Flu negative - POC Influenza A&B(BINAX/QUICKVUE)  5. Cough Trial albuterol at minimum BID until next week (has had in past with bronchitis). Will cover for secondary infection due to worsening of symptoms with chills, sweats. - albuterol (PROVENTIL HFA;VENTOLIN HFA) 108 (90 Base) MCG/ACT inhaler; Inhale 2 puffs into the lungs every 6 (six) hours as needed for wheezing or shortness of breath.  Dispense: 1 Inhaler; Refill: 1 - doxycycline (VIBRAMYCIN) 100 MG capsule; Take 1 capsule (100 mg total) by mouth 2 (two) times daily for 7 days.  Dispense: 14 capsule; Refill: 0 - Spacer/Aero-Holding Chambers (E-Z SPACER) inhaler; Use as instructed  Dispense: 1 each; Refill: 2  6. Lipid screening  - Lipid panel; Future  7. Elevated glucose  - Hemoglobin A1c; Future - Comprehensive metabolic panel; Future  Return in about 3 weeks (around 04/17/2018).encouraged to check bp at home and bring cuff to appointment with recorded blood pressures.   Theodis Shove, MD

## 2018-03-27 NOTE — Patient Instructions (Signed)
Let me know if any worsening of symptoms.   Track blood pressure at home; record and bring readings and your cuff to next visit.   bloodwork when feeling better.

## 2018-03-28 ENCOUNTER — Encounter: Payer: Self-pay | Admitting: Family Medicine

## 2018-03-30 ENCOUNTER — Encounter: Payer: Self-pay | Admitting: Family Medicine

## 2018-03-31 NOTE — Telephone Encounter (Signed)
Copied from CRM [id number]. Topic: Quick Communication - See Telephone Encounter >> Mar 31, 2018 10:36 AM Lorrine Kin, NT wrote: CRM for notification. See Telephone encounter for: 03/31/18.  Refer to MyChart Message from 03/30/2018:  Patient calling to check the status. States that he is feeling a little better, but the cough is still the same. He did not go to work today because of the cough. Would like to know if there is a stronger medication that he could get OTC or be prescribed to help with the coughing? Please advise.

## 2018-03-31 NOTE — Telephone Encounter (Signed)
Copied from CRM [id number]. Topic: Quick Communication - See Telephone Encounter >> Mar 31, 2018  3:04 PM Terisa Starr wrote: CRM for notification. See Telephone encounter for: 03/31/18.  Pt is calling back to check the status. Advised Dr Hassan Rowan is not in the office today

## 2018-04-01 ENCOUNTER — Other Ambulatory Visit: Payer: Self-pay | Admitting: Family Medicine

## 2018-04-01 DIAGNOSIS — R05 Cough: Secondary | ICD-10-CM

## 2018-04-01 DIAGNOSIS — R059 Cough, unspecified: Secondary | ICD-10-CM

## 2018-04-01 NOTE — Telephone Encounter (Signed)
Addressed in another message.

## 2018-04-01 NOTE — Telephone Encounter (Signed)
Pt calling to check status  °

## 2018-04-13 ENCOUNTER — Ambulatory Visit (INDEPENDENT_AMBULATORY_CARE_PROVIDER_SITE_OTHER): Payer: 59

## 2018-04-13 ENCOUNTER — Encounter: Payer: Self-pay | Admitting: Family Medicine

## 2018-04-13 ENCOUNTER — Ambulatory Visit: Payer: 59 | Admitting: Family Medicine

## 2018-04-13 VITALS — BP 140/80 | HR 110 | Temp 98.9°F | Wt 316.4 lb

## 2018-04-13 DIAGNOSIS — J101 Influenza due to other identified influenza virus with other respiratory manifestations: Secondary | ICD-10-CM

## 2018-04-13 DIAGNOSIS — R7309 Other abnormal glucose: Secondary | ICD-10-CM

## 2018-04-13 DIAGNOSIS — Z13220 Encounter for screening for lipoid disorders: Secondary | ICD-10-CM

## 2018-04-13 DIAGNOSIS — R059 Cough, unspecified: Secondary | ICD-10-CM

## 2018-04-13 DIAGNOSIS — R05 Cough: Secondary | ICD-10-CM

## 2018-04-13 LAB — COMPREHENSIVE METABOLIC PANEL
ALT: 28 U/L (ref 0–53)
AST: 24 U/L (ref 0–37)
Albumin: 4.7 g/dL (ref 3.5–5.2)
Alkaline Phosphatase: 66 U/L (ref 39–117)
BUN: 14 mg/dL (ref 6–23)
CO2: 28 meq/L (ref 19–32)
CREATININE: 1.07 mg/dL (ref 0.40–1.50)
Calcium: 9.7 mg/dL (ref 8.4–10.5)
Chloride: 101 mEq/L (ref 96–112)
GFR: 75.28 mL/min (ref >60.00)
GLUCOSE: 91 mg/dL (ref 70–99)
Potassium: 4.3 mEq/L (ref 3.5–5.1)
SODIUM: 137 meq/L (ref 135–145)
Total Bilirubin: 0.6 mg/dL (ref 0.2–1.2)
Total Protein: 7.5 g/dL (ref 6.0–8.3)

## 2018-04-13 LAB — LIPID PANEL
CHOL/HDL RATIO: 5
Cholesterol: 109 mg/dL (ref 0–200)
HDL: 20.3 mg/dL — ABNORMAL LOW (ref >39.00)
LDL CALC: 58 mg/dL (ref 0–99)
NONHDL: 88.24
TRIGLYCERIDES: 149 mg/dL (ref 0.0–149.0)
VLDL: 29.8 mg/dL (ref 0.0–40.0)

## 2018-04-13 LAB — HEMOGLOBIN A1C: HEMOGLOBIN A1C: 5.5 % (ref 4.6–6.5)

## 2018-04-13 MED ORDER — OSELTAMIVIR PHOSPHATE 75 MG PO CAPS: 75.0000 mg | ORAL_CAPSULE | Freq: Two times a day (BID) | ORAL | 0 refills | Status: DC

## 2018-04-13 NOTE — Telephone Encounter (Signed)
Pt seen by Dr Hassan Rowan this morning. Nothing further needed.

## 2018-04-13 NOTE — Progress Notes (Signed)
Charles Olsen DOB: 02/20/1976 Encounter date: 04/13/2018  This is a 43 y.o. male who presents with Chief Complaint  Patient presents with  . Follow-up    patient states he got better but went back to work and started feeling bad aging. 4 people with the flu at word, chills, sweats, headache, congestion    History of present illness:  Cough never went away completely.   Same week he went back to work 4 people tested positive for flu. Sat night woke up fever, sweating, headache, teeth chattering. Still w headache. Significant mucous from sinuses. Chest and lower back still bothering him.   Chest more sore on right side. Just never went away.   Cough is dry, hacking. Sinus congestion significant. Cough bad enough making him want to vomit. Coughing so much he is getting dizzy spells.   Yesterday dizzy with headache. Slightly dizzy today. Yesterday had vertigo when he would close eyes.   So fatigued yesterday couldn't even make it from couch to bedroom. Sweats, fevers, chills through night last night.     Allergies  Allergen Reactions  . Mushroom Extract Complex Hives  . Oxycontin [Oxycodone Hcl]    Current Meds  Medication Sig  . acetaminophen (TYLENOL) 325 MG tablet Take 650 mg by mouth every 6 (six) hours as needed for mild pain or headache.  . albuterol (PROVENTIL HFA;VENTOLIN HFA) 108 (90 Base) MCG/ACT inhaler Inhale 2 puffs into the lungs every 6 (six) hours as needed for wheezing or shortness of breath.  . Spacer/Aero-Holding Chambers (E-Z SPACER) inhaler Use as instructed    Review of Systems  Constitutional: Positive for activity change, chills, fatigue and fever.  HENT: Positive for congestion, postnasal drip, sinus pressure, sinus pain and sore throat. Negative for ear pain.   Respiratory: Positive for cough. Negative for chest tightness, shortness of breath and wheezing.   Cardiovascular: Positive for chest pain (right sided chest; hurts wiht pressure or with  cough).    Objective:  BP 140/80 (BP Location: Right Arm, Patient Position: Sitting, Cuff Size: Large)   Pulse (!) 110   Temp 98.9 F (37.2 C) (Oral)   Wt (!) 316 lb 6.4 oz (143.5 kg)   SpO2 95%   BMI 38.51 kg/m   Weight: (!) 316 lb 6.4 oz (143.5 kg)   BP Readings from Last 3 Encounters:  04/13/18 140/80  03/27/18 (!) 150/64  01/19/18 (!) 141/86   Wt Readings from Last 3 Encounters:  04/13/18 (!) 316 lb 6.4 oz (143.5 kg)  03/27/18 (!) 323 lb 3.2 oz (146.6 kg)  07/31/17 (!) 325 lb (147.4 kg)    Physical Exam Constitutional:      General: He is not in acute distress.    Appearance: He is well-developed.  HENT:     Head: Normocephalic and atraumatic.     Right Ear: Tympanic membrane, ear canal and external ear normal.     Left Ear: Tympanic membrane, ear canal and external ear normal.     Nose: Mucosal edema and congestion present.     Right Sinus: Maxillary sinus tenderness and frontal sinus tenderness present.     Left Sinus: Maxillary sinus tenderness and frontal sinus tenderness present.     Mouth/Throat:     Pharynx: Uvula midline. Posterior oropharyngeal erythema present.  Cardiovascular:     Rate and Rhythm: Normal rate and regular rhythm.  Pulmonary:     Effort: Pulmonary effort is normal. No respiratory distress.     Breath sounds: Normal breath  sounds. No wheezing, rhonchi or rales.     Assessment/Plan  1. Influenza A Due to worsening of symptoms and lack of recovery from previous illness, will treat with tamiflu. Rest, fluids, monitor symptoms. Let me know if any worsening.  - oseltamivir (TAMIFLU) 75 MG capsule; Take 1 capsule (75 mg total) by mouth 2 (two) times daily.  Dispense: 10 capsule; Refill: 0  2. Cough See above. Discussed timeline for recovery from flu. Will also check CXR today for him for follow up from previous.   He is completing follow up bloodwork today.   Return if symptoms worsen or fail to improve.     Theodis Shove, MD

## 2018-04-15 ENCOUNTER — Encounter: Payer: Self-pay | Admitting: Family Medicine

## 2018-04-15 NOTE — Telephone Encounter (Signed)
OK to write out for rest of week. Have him update us Monday if not better.

## 2018-04-16 ENCOUNTER — Telehealth: Payer: Self-pay | Admitting: *Deleted

## 2018-04-16 NOTE — Telephone Encounter (Signed)
Copied from CRM [id number]. Topic: General - Other >> Apr 16, 2018 11:58 AM Arlyss Gandy, NT wrote: Reason for CRM: Pts wife calling to request an order for her husband to have a prostate exam. Please advise.

## 2018-04-16 NOTE — Telephone Encounter (Signed)
Can you get more information about this/why she is requesting this?  I have seen him with respiratory illness and then he got the flu. We did some baseline bloodwork. We typically do not do bloodwork/exam for prostate because our screening tools are not great unless there is significant family history or we suspect prostate infection/enlargement.   If you can get info about whey she is asking; we can either discuss or have him return for exam if needed.   Thanks!

## 2018-04-17 ENCOUNTER — Ambulatory Visit: Payer: 59 | Admitting: Family Medicine

## 2018-04-17 NOTE — Telephone Encounter (Signed)
Will send to Dr. Koberlein as FYI 

## 2018-04-17 NOTE — Telephone Encounter (Signed)
Spoke with patient wife, states that her husband is needing a physical with a prostate exam. They are wanting his prostate checked - wife states that she thought this was part of the routine CPE exam.   CPE scheduled for 05/22/2018 at 11am - pt aware to fast.   Will send to Dr Hassan Rowan to okay the appt and to give any further information regarding the physical exam

## 2018-04-17 NOTE — Telephone Encounter (Signed)
Noted patient is aware 

## 2018-04-17 NOTE — Telephone Encounter (Signed)
Sounds good. I can check his prostate; we will just discuss limitations of screening at that physical. Thanks!

## 2018-05-08 ENCOUNTER — Encounter: Payer: Self-pay | Admitting: Pulmonary Disease

## 2018-05-08 ENCOUNTER — Ambulatory Visit (INDEPENDENT_AMBULATORY_CARE_PROVIDER_SITE_OTHER): Payer: 59 | Admitting: Pulmonary Disease

## 2018-05-08 VITALS — BP 136/80 | HR 96 | Ht 76.0 in | Wt 322.4 lb

## 2018-05-08 DIAGNOSIS — G4733 Obstructive sleep apnea (adult) (pediatric): Secondary | ICD-10-CM | POA: Diagnosis not present

## 2018-05-08 NOTE — Assessment & Plan Note (Signed)
Weight loss encouraged Advised against medications with sedative side effects Cautioned against driving when sleepy - understanding that sleepiness will vary on a day to day basis  

## 2018-05-08 NOTE — Assessment & Plan Note (Signed)
Given excessive daytime somnolence, narrow pharyngeal exam, witnessed apneas & loud snoring, obstructive sleep apnea is very likely & an overnight polysomnogram will be scheduled as a home study. The pathophysiology of obstructive sleep apnea , it's cardiovascular consequences & modes of treatment including CPAP were discused with the patient in detail & they evidenced understanding.  Pretest probability is high to intermediate.  He does not seem to have excessive daytime somnolence but possible that he is overcoming this due to his high strung personality

## 2018-05-08 NOTE — Progress Notes (Signed)
Subjective:    Patient ID: Charles Olsen, male    DOB: 04/26/1975, 43 y.o.   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 He is accompanied by his wife who provides a picture and corroborates his sleep history. She has noted loud snoring and stopping breathing during his sleep.  He was hospitalized a few years ago for arrhythmia and ICU nurses witnessed apneas. Epworth sleepiness score is 5 and he denies excessive daytime somnolence. Bedtime is between 10 PM and 11:30 PM, TV stays on during the night for his wife, sleep latency is minimal, 44-year-old grandchild shares the bed, he sleeps on his right side with one pillow, has been sleeping more soundly last 2 months please take melatonin, reports treated for spontaneous awakenings including nocturia and is out of bed by 5 AM feeling rested with occasional dryness of mouth and headaches. He denies excessive use of caffeinated beverages. He has lost 20 pounds in the last 2 months due to an episode of flu  There is no history suggestive of cataplexy, sleep paralysis or parasomnias Heart rate was 109 on arrival which on repeat testing was 96   Past Medical History:  Diagnosis Date  . Hypertension     Past Surgical History:  Procedure Laterality Date  . FINGER SURGERY Left    middle finger    Allergies  Allergen Reactions  . Mushroom Extract Complex Hives  . Oxycontin [Oxycodone Hcl]     Social History   Socioeconomic History  . Marital status: Married    Spouse name: [name]   . Number of children: 1  . Years of education: HS  . Highest education level: Not on file  Occupational History  . Occupation: Geographical information systems officer: [employer]  Social Needs  . Financial resource strain: Not on file  . Food insecurity:    Worry: Not on file    Inability: Not on file  . Transportation needs:    Medical: Not on file   Non-medical: Not on file  Tobacco Use  . Smoking status: Never Smoker  . Smokeless tobacco: Never Used  Substance and Sexual Activity  . Alcohol use: No  . Drug use: No  . Sexual activity: Not on file  Lifestyle  . Physical activity:    Days per week: Not on file    Minutes per session: Not on file  . Stress: Not on file  Relationships  . Social connections:    Talks on phone: Not on file    Gets together: Not on file    Attends religious service: Not on file    Active member of club or organization: Not on file    Attends meetings of clubs or organizations: Not on file    Relationship status: Not on file  . Intimate partner violence:    Fear of current or ex partner: Not on file    Emotionally abused: Not on file    Physically abused: Not on file    Forced sexual activity: Not on file  Other Topics Concern  . Not on file  Social History Narrative   Patient lives at home with spouse.   Caffeine Use: 1 soda every couple days    Family History  Problem Relation Age of Onset  . Osteoarthritis Mother   . Dementia Mother 40  . Alcohol abuse Father   . Arthritis Father   . Diabetes Father   . Depression  Father   . Heart attack Father   . Heart disease Father   . High blood pressure Father   . High Cholesterol Father   . Kidney disease Father   . Stroke Father 26  . Alzheimer's disease Maternal Grandmother   . Cancer Paternal Grandmother       Review of Systems  Complains of irregular heartbeat in acid heartburn and joint stiffness  Constitutional: negative for anorexia, fevers and sweats  Eyes: negative for irritation, redness and visual disturbance  Ears, nose, mouth, throat, and face: negative for earaches, epistaxis, nasal congestion and sore throat  Respiratory: negative for cough, dyspnea on exertion, sputum and wheezing  Cardiovascular: negative for chest pain, dyspnea, lower extremity edema, orthopnea, palpitations and syncope  Gastrointestinal: negative  for abdominal pain, constipation, diarrhea, melena, nausea and vomiting  Genitourinary:negative for dysuria, frequency and hematuria  Hematologic/lymphatic: negative for bleeding, easy bruising and lymphadenopathy  Musculoskeletal:negative for arthralgias, muscle weakness   Neurological: negative for coordination problems, gait problems, headaches and weakness  Endocrine: negative for diabetic symptoms including polydipsia, polyuria and weight loss     Objective:   Physical Exam  Gen. Pleasant, obese/ muscular , in no distress, normal affect ENT - no pallor,icterus, no post nasal drip, class 3 airway Neck: No JVD, no thyromegaly, no carotid bruits Lungs: no use of accessory muscles, no dullness to percussion, decreased without rales or rhonchi  Cardiovascular: Rhythm regular, heart sounds  normal, no murmurs or gallops, no peripheral edema Abdomen: soft and non-tender, no hepatosplenomegaly, BS normal. Musculoskeletal: No deformities, no cyanosis or clubbing Neuro:  alert, non focal, no tremors       Assessment & Plan:

## 2018-05-08 NOTE — Patient Instructions (Signed)
Schedule home sleep study.   

## 2018-05-22 ENCOUNTER — Encounter: Payer: 59 | Admitting: Family Medicine

## 2018-05-27 ENCOUNTER — Other Ambulatory Visit: Payer: Self-pay

## 2018-05-27 DIAGNOSIS — G4733 Obstructive sleep apnea (adult) (pediatric): Secondary | ICD-10-CM

## 2018-06-02 ENCOUNTER — Telehealth: Payer: Self-pay | Admitting: Pulmonary Disease

## 2018-06-02 DIAGNOSIS — G4733 Obstructive sleep apnea (adult) (pediatric): Secondary | ICD-10-CM

## 2018-06-02 NOTE — Telephone Encounter (Signed)
Per RA, patient has severe OSA with 95 events per hour. Recommends RX autocpap 5-20cm, full face mask of choice. OV (or televisit) in 6 weeks.

## 2018-06-02 NOTE — Telephone Encounter (Signed)
Spoke with patient. He is aware of results. Wishes to proceed with CPAP. Reminder has been placed for me to get him scheduled for a follow up.   Nothing further needed at time of call.

## 2018-06-24 ENCOUNTER — Telehealth: Payer: Self-pay | Admitting: Pulmonary Disease

## 2018-06-24 NOTE — Telephone Encounter (Signed)
Received a fax from [place] from [place] at [employer] stating that the patient has been contacted 3 times in regards to getting setup with a cpap machine. So far he has not called back. Due to this, the sales order has been voided until the patient calls back.   Reached out to the patient. He stated that he had been in contact with [employer] at least 4 times. [employer] is asking him to put a $500 down and pay a large amount of money per month. He works in [employer] and is unsure of his future income with COVID-19. Offered to send the order to another DME to see if the price would be different, patient said he would think about it and call back when things settle down.

## 2018-06-26 ENCOUNTER — Encounter: Payer: 59 | Admitting: Family Medicine

## 2018-09-30 ENCOUNTER — Encounter: Payer: 59 | Admitting: Family Medicine

## 2021-03-21 ENCOUNTER — Telehealth (INDEPENDENT_AMBULATORY_CARE_PROVIDER_SITE_OTHER): Payer: Self-pay | Admitting: Family Medicine

## 2021-03-21 DIAGNOSIS — R059 Cough, unspecified: Secondary | ICD-10-CM

## 2021-03-21 MED ORDER — HYDROCODONE BIT-HOMATROP MBR 5-1.5 MG/5ML PO SOLN: 5.0000 mL | Freq: Four times a day (QID) | ORAL | 0 refills | Status: DC | PRN

## 2021-03-21 MED ORDER — DOXYCYCLINE HYCLATE 100 MG PO CAPS: 100.0000 mg | ORAL_CAPSULE | Freq: Two times a day (BID) | ORAL | 0 refills | Status: DC

## 2021-03-21 NOTE — Progress Notes (Signed)
Patient ID: Charles Olsen, male   DOB: 11-15-1975, 46 y.o.   MRN: [id number]   This visit type was conducted due to national recommendations for restrictions regarding the COVID-19 pandemic in an effort to limit this patient's exposure and mitigate transmission in our community.   Virtual Visit via Video Note  I connected with Dysen Edmondson on 03/21/21 at  5:00 PM EST by a video enabled telemedicine application and verified that I am speaking with the correct person using two identifiers.  Location patient: home Location provider:work or home office Persons participating in the virtual visit: patient, provider  I discussed the limitations of evaluation and management by telemedicine and the availability of in person appointments. The patient expressed understanding and agreed to proceed.   HPI:  Attilio called with onset about 4 days ago of cough and possible low-grade fever intermittently.  His cough has been increasing productive of thick colored sputum past few days.  Increased malaise.  Wife has had similar symptoms.  No dyspnea.  Denies any nausea, vomiting, or diarrhea.  Cough is interfering with sleep.  Has not done any COVID testing. He has still been working full-time.  Non-smoker.  ROS: See pertinent positives and negatives per HPI.  Past Medical History:  Diagnosis Date   Hypertension     Past Surgical History:  Procedure Laterality Date   FINGER SURGERY Left    middle finger    Family History  Problem Relation Age of Onset   Osteoarthritis Mother    Dementia Mother 50   Alcohol abuse Father    Arthritis Father    Diabetes Father    Depression Father    Heart attack Father    Heart disease Father    High blood pressure Father    High Cholesterol Father    Kidney disease Father    Stroke Father 35   Alzheimer's disease Maternal Grandmother    Cancer Paternal Grandmother     SOCIAL HX: Non-smoker   Current Outpatient Medications:    doxycycline  (VIBRAMYCIN) 100 MG capsule, Take 1 capsule (100 mg total) by mouth 2 (two) times daily., Disp: 14 capsule, Rfl: 0   HYDROcodone bit-homatropine (HYCODAN) 5-1.5 MG/5ML syrup, Take 5 mLs by mouth every 6 (six) hours as needed for cough., Disp: 120 mL, Rfl: 0   acetaminophen (TYLENOL) 325 MG tablet, Take 650 mg by mouth every 6 (six) hours as needed for mild pain or headache., Disp: , Rfl:    albuterol (PROVENTIL HFA;VENTOLIN HFA) 108 (90 Base) MCG/ACT inhaler, Inhale 2 puffs into the lungs every 6 (six) hours as needed for wheezing or shortness of breath., Disp: 1 Inhaler, Rfl: 1   oseltamivir (TAMIFLU) 75 MG capsule, Take 1 capsule (75 mg total) by mouth 2 (two) times daily. (Patient not taking: Reported on 05/08/2018), Disp: 10 capsule, Rfl: 0   Spacer/Aero-Holding Chambers (E-Z SPACER) inhaler, Use as instructed, Disp: 1 each, Rfl: 2  EXAM:  VITALS per patient if applicable:  GENERAL: alert, oriented, appears well and in no acute distress  HEENT: atraumatic, conjunttiva clear, no obvious abnormalities on inspection of external nose and ears  NECK: normal movements of the head and neck  LUNGS: on inspection no signs of respiratory distress, breathing rate appears normal, no obvious gross SOB, gasping or wheezing  CV: no obvious cyanosis  MS: moves all visible extremities without noticeable abnormality  PSYCH/NEURO: pleasant and cooperative, no obvious depression or anxiety, speech and thought processing grossly intact  ASSESSMENT AND PLAN:  Discussed the following assessment and plan:  Cough.  Suspect acute bronchitis.  Patient is in no respiratory distress.  Symptoms have worsened over the past several days.  -We discussed trial of Hycodan cough syrup 1 teaspoon nightly for severe cough.  He states he has tolerated this well in the past with no side effects.  He is aware this may cause some sedation. -Consider over-the-counter Mucinex twice daily -We discussed the fact that this is  potentially all viral but he has had worsening symptoms past several days.  We decided to go and cover with doxycycline 100 mg twice daily for 7 days.  Follow-up for any persistent or worsening symptoms.     I discussed the assessment and treatment plan with the patient. The patient was provided an opportunity to ask questions and all were answered. The patient agreed with the plan and demonstrated an understanding of the instructions.   The patient was advised to call back or seek an in-person evaluation if the symptoms worsen or if the condition fails to improve as anticipated.     Evelena Peat, MD

## 2021-03-22 ENCOUNTER — Telehealth: Payer: Self-pay | Admitting: Family Medicine

## 2021-03-22 NOTE — Telephone Encounter (Signed)
Patient called because pharmacy has told him that the HYDROcodone bit-homatropine (HYCODAN) 5-1.5 MG/5ML syrup is on back order and a different prescription will need to be sent in.     Please send to   [facility] #[id number] [place], [place] - [address] AT [facility] OF [address] & [facility] [address] Phone:  [phone]  Fax:  [phone]          Please advise

## 2021-03-23 ENCOUNTER — Other Ambulatory Visit: Payer: Self-pay | Admitting: Family Medicine

## 2021-03-23 ENCOUNTER — Telehealth: Payer: Self-pay | Admitting: Family Medicine

## 2021-03-23 ENCOUNTER — Telehealth: Payer: Self-pay

## 2021-03-23 MED ORDER — HYDROCOD POLST-CPM POLST ER 10-8 MG/5ML PO SUER: 5.0000 mL | Freq: Two times a day (BID) | ORAL | 0 refills | Status: DC | PRN

## 2021-03-23 NOTE — Telephone Encounter (Signed)
Spoke with the patient. He is aware that a new rx has been sent in.

## 2021-03-23 NOTE — Telephone Encounter (Signed)
Patient called in and stated his pharmacy did not receive the tussionex. Confirmed with the pharmacy.  Please re-send this prescription.

## 2021-03-23 NOTE — Telephone Encounter (Signed)
I have resent for Dr. Elease Hashimoto.

## 2021-03-23 NOTE — Telephone Encounter (Signed)
Sent in limited Tussionex (in place of Hycodan) one tsp po qhs prn cough

## 2021-03-23 NOTE — Telephone Encounter (Signed)
Spoke with the patient and he is aware

## 2021-03-23 NOTE — Telephone Encounter (Signed)
This was sent in this morning. The pharmacy did not receive the Rx. I am unable to send this in since it is controlled. I have already sent a message back to Dr. Caryl Never asking him to resend it and am waiting on a response. Patient has been made aware of this as well as his wife multiple time.

## 2021-03-23 NOTE — Telephone Encounter (Signed)
Pharmacist stated wife called and said she wanted the cough medicine filled at [facility] on [address]

## 2021-03-26 ENCOUNTER — Telehealth: Payer: Self-pay | Admitting: Family Medicine

## 2021-03-26 NOTE — Telephone Encounter (Signed)
Pt had virtual with dr Caryl Never on 03-21-2021. Pt is calling back to report chlorpheniramine-HYDROcodone (TUSSIONEX PENNKINETIC ER) 10-8 MG/5ML SURE. Pt is no working for his cough. Pt advise Pt co worker rides with him and he was dx with the flu on 03-22-2021. Pt now uses  [facility] #[id number] [place], [place] - [address] AT [facility] OF [address] & [facility] [address] Phone:  [phone]  Fax:  [phone]

## 2021-03-26 NOTE — Telephone Encounter (Signed)
Left message for patient to call back  

## 2021-03-26 NOTE — Telephone Encounter (Signed)
Spoke with the patient. He is aware of Dr. Mar Daring message. No new symptoms to report. Appointment was scheduled with PCP.

## 2021-03-30 ENCOUNTER — Other Ambulatory Visit: Payer: Self-pay | Admitting: Family Medicine

## 2021-03-30 ENCOUNTER — Ambulatory Visit: Payer: Self-pay

## 2021-03-30 ENCOUNTER — Ambulatory Visit (INDEPENDENT_AMBULATORY_CARE_PROVIDER_SITE_OTHER): Payer: Self-pay | Admitting: Family Medicine

## 2021-03-30 ENCOUNTER — Other Ambulatory Visit: Payer: Self-pay

## 2021-03-30 ENCOUNTER — Encounter: Payer: Self-pay | Admitting: Family Medicine

## 2021-03-30 ENCOUNTER — Telehealth: Payer: Self-pay | Admitting: Family Medicine

## 2021-03-30 ENCOUNTER — Ambulatory Visit (INDEPENDENT_AMBULATORY_CARE_PROVIDER_SITE_OTHER): Payer: Self-pay

## 2021-03-30 VITALS — BP 138/90 | HR 65 | Temp 98.2°F | Ht 76.0 in | Wt 305.8 lb

## 2021-03-30 DIAGNOSIS — R059 Cough, unspecified: Secondary | ICD-10-CM

## 2021-03-30 DIAGNOSIS — Z131 Encounter for screening for diabetes mellitus: Secondary | ICD-10-CM

## 2021-03-30 DIAGNOSIS — Z13220 Encounter for screening for lipoid disorders: Secondary | ICD-10-CM

## 2021-03-30 LAB — COMPREHENSIVE METABOLIC PANEL
ALT: 17 U/L (ref 0–53)
AST: 18 U/L (ref 0–37)
Albumin: 4.5 g/dL (ref 3.5–5.2)
Alkaline Phosphatase: 68 U/L (ref 39–117)
BUN: 14 mg/dL (ref 6–23)
CO2: 31 mEq/L (ref 19–32)
Calcium: 9.6 mg/dL (ref 8.4–10.5)
Chloride: 102 mEq/L (ref 96–112)
Creatinine, Ser: 0.99 mg/dL (ref 0.40–1.50)
GFR: 91.43 mL/min (ref >60.00)
Glucose, Bld: 91 mg/dL (ref 70–99)
Potassium: 4.6 mEq/L (ref 3.5–5.1)
Sodium: 137 mEq/L (ref 135–145)
Total Bilirubin: 0.6 mg/dL (ref 0.2–1.2)
Total Protein: 7.6 g/dL (ref 6.0–8.3)

## 2021-03-30 LAB — CBC WITH DIFFERENTIAL/PLATELET
Basophils Absolute: 0 10*3/uL (ref 0.0–0.1)
Basophils Relative: 0.3 % (ref 0.0–3.0)
Eosinophils Absolute: 0.2 10*3/uL (ref 0.0–0.7)
Eosinophils Relative: 2.2 % (ref 0.0–5.0)
HCT: 44 % (ref 39.0–52.0)
Hemoglobin: 15 g/dL (ref 13.0–17.0)
Lymphocytes Relative: 31.5 % (ref 12.0–46.0)
Lymphs Abs: 2.5 10*3/uL (ref 0.7–4.0)
MCHC: 34 g/dL (ref 30.0–36.0)
MCV: 86.7 fl (ref 78.0–100.0)
Monocytes Absolute: 0.6 10*3/uL (ref 0.1–1.0)
Monocytes Relative: 7.9 % (ref 3.0–12.0)
Neutro Abs: 4.6 10*3/uL (ref 1.4–7.7)
Neutrophils Relative %: 58.1 % (ref 43.0–77.0)
Platelets: 270 10*3/uL (ref 150.0–400.0)
RBC: 5.08 Mil/uL (ref 4.22–5.81)
RDW: 12.8 % (ref 11.5–15.5)
WBC: 7.9 10*3/uL (ref 4.0–10.5)

## 2021-03-30 LAB — LDL CHOLESTEROL, DIRECT: Direct LDL: 85 mg/dL

## 2021-03-30 LAB — LIPID PANEL
Cholesterol: 149 mg/dL (ref 0–200)
HDL: 24.2 mg/dL — ABNORMAL LOW (ref >39.00)
NonHDL: 124.55
Total CHOL/HDL Ratio: 6
Triglycerides: 323 mg/dL — ABNORMAL HIGH (ref 0.0–149.0)
VLDL: 64.6 mg/dL — ABNORMAL HIGH (ref 0.0–40.0)

## 2021-03-30 LAB — POCT INFLUENZA A/B
Influenza A, POC: NEGATIVE
Influenza B, POC: NEGATIVE

## 2021-03-30 LAB — POC COVID19 BINAXNOW: SARS Coronavirus 2 Ag: NEGATIVE

## 2021-03-30 MED ORDER — HYDROCOD POLI-CHLORPHE POLI ER 10-8 MG/5ML PO SUER: 5.0000 mL | Freq: Two times a day (BID) | ORAL | 0 refills | Status: DC | PRN

## 2021-03-30 MED ORDER — HYDROCODONE BIT-HOMATROP MBR 5-1.5 MG/5ML PO SOLN: 5.0000 mL | Freq: Three times a day (TID) | ORAL | 0 refills | Status: DC | PRN

## 2021-03-30 NOTE — Telephone Encounter (Signed)
Charles Olsen at  [facility] #[id number] - [place], [place] - [address] AT [facility] OF [address] & [facility] [address] Phone:  [phone]  Fax:  [phone]     Is calling and HYDROcodone bit-homatropine (HYCODAN) 5-1.5 MG/5ML syrup is on back order and sarah would like to know if md would like to prescribe something else

## 2021-03-30 NOTE — Telephone Encounter (Signed)
Tussionex sent.

## 2021-03-30 NOTE — Progress Notes (Signed)
Charles Olsen DOB: 18-Dec-1975 Encounter date: 03/30/2021  This is a 46 y.o. male who presents with Chief Complaint  Patient presents with   Cough    Patient complains of recurrent productive with yellow-green sputum x11 days, no better since virtual visit with Dr Caryl Never on January 11th   Night Sweats    History of present illness:  States was working in rain for a few days, started with congestion/fever/sweats after a few days of this. Some sneezing, then felt like he had chest congestion. Side started hurting from coughing. Had one low grade 100 temp, but nothing else. Once he started the doxycycline, he did start to feel better. Everyone in the house was also sick with cough. Was taking theraflu; not much else available on the shelves.   Minimal productive cough. Still feels tight in the chest. This morning woke up and felt better, but still just feels sick. No other sick sx.   Feels about 50% better than last week. Just still feels achy, sick. Has been eating ok; just watching what he is eating. Trying to eat better so that he can maintain lower weight and lose a little more. Feels much better at lower weight.    Allergies  Allergen Reactions   Mushroom Extract Complex Hives   Oxycontin [Oxycodone Hcl]    Current Meds  Medication Sig   ELDERBERRY PO Take by mouth daily.   HYDROcodone bit-homatropine (HYCODAN) 5-1.5 MG/5ML syrup Take 5 mLs by mouth every 8 (eight) hours as needed for cough.   OVER THE COUNTER MEDICATION [place]-once a day    Review of Systems  Constitutional:  Negative for chills, fatigue and fever.  Respiratory:  Negative for cough, chest tightness, shortness of breath and wheezing.   Cardiovascular:  Negative for chest pain, palpitations and leg swelling.   Objective:  BP 138/90 (BP Location: Right Arm, Patient Position: Sitting, Cuff Size: Large)    Pulse 65    Temp 98.2 F (36.8 C) (Oral)    Ht 6\' 4"  (1.93 m)    Wt (!) 305 lb 12.8 oz (138.7  kg)    SpO2 95%    BMI 37.22 kg/m   Weight: (!) 305 lb 12.8 oz (138.7 kg)   BP Readings from Last 3 Encounters:  03/30/21 138/90  05/08/18 136/80  04/13/18 140/80   Wt Readings from Last 3 Encounters:  03/30/21 (!) 305 lb 12.8 oz (138.7 kg)  05/08/18 (!) 322 lb 6.4 oz (146.2 kg)  04/13/18 (!) 316 lb 6.4 oz (143.5 kg)    Physical Exam Constitutional:      General: He is not in acute distress.    Appearance: He is well-developed.  HENT:     Right Ear: Tympanic membrane and ear canal normal.     Left Ear: Tympanic membrane and ear canal normal.     Mouth/Throat:     Mouth: Mucous membranes are moist.     Pharynx: Oropharynx is clear.  Cardiovascular:     Rate and Rhythm: Normal rate and regular rhythm.     Heart sounds: Normal heart sounds. No murmur heard.   No friction rub.  Pulmonary:     Effort: Pulmonary effort is normal. No respiratory distress.     Breath sounds: Normal breath sounds. No wheezing or rales.  Musculoskeletal:     Right lower leg: No edema.     Left lower leg: No edema.  Neurological:     Mental Status: He is alert and oriented to  person, place, and time.  Psychiatric:        Behavior: Behavior normal.    Assessment/Plan  1. Cough, unspecified type Improving, but he still feels sick.  We did test for flu and COVID today just to make sure he did not have a secondary viral infection.  These were both negative.  Chest x-ray only if worsening of symptoms or not feeling like he is still continuing to improve through the weekend.  Cough syrup refilled today. - CBC with Differential/Platelet; Future - DG Chest 2 View; Future  2. Lipid screening - Lipid panel; Future  3. Screening for diabetes mellitus - Comprehensive metabolic panel; Future   Return in about 2 months (around 05/28/2021) for physical exam.     Theodis Shove, MD

## 2021-04-02 ENCOUNTER — Telehealth: Payer: Self-pay | Admitting: Family Medicine

## 2021-04-02 NOTE — Telephone Encounter (Signed)
Spoke with Tera at [facility], she checked with the pharmacist and stated the Rx is not on back order.  Tera stated the Rx has been ready for pick up since 1/22 and I left a detailed message with this information at the patient's cell number.  Also left a message for the patient to call back to discuss results notes.

## 2021-04-02 NOTE — Telephone Encounter (Signed)
Patient called because chlorpheniramine-HYDROcodone [facility] ER) 10-8 MG/5ML is on back order. Patient states an alternative will need to be sent in.    Please send to  [facility] #[id number] [place], [place] - [address] AT [facility] OF [address] & [facility] [address] Phone:  [phone]  Fax:  [phone]         Please advise

## 2021-04-02 NOTE — Telephone Encounter (Signed)
Please check and see what pharmacy does have? We have tried to send two and they didn't have either.

## 2021-04-02 NOTE — Telephone Encounter (Signed)
See results note. 

## 2021-04-02 NOTE — Telephone Encounter (Signed)
Patient is returning Joanne's call. 

## 2021-04-02 NOTE — Telephone Encounter (Signed)
Pt is calling back and would like blood work results 

## 2021-12-12 ENCOUNTER — Telehealth: Payer: Self-pay | Admitting: Family Medicine

## 2021-12-12 NOTE — Telephone Encounter (Signed)
Called patient to schedule TOC. Patient stated he is out of town on work and he will callback when he is in town to schedule. I let patient know that he currently does not have a pcp and if he needs medication refills then it may be difficult to fill them. Patient verbalized understanding,       Charles Olsen

## 2022-08-10 ENCOUNTER — Other Ambulatory Visit: Payer: Self-pay

## 2022-08-10 ENCOUNTER — Emergency Department (HOSPITAL_COMMUNITY): Payer: BC Managed Care – PPO

## 2022-08-10 ENCOUNTER — Emergency Department (HOSPITAL_COMMUNITY)
Admission: EM | Admit: 2022-08-10 | Discharge: 2022-08-10 | Disposition: A | Discharge status: Home / Self Care | Payer: BC Managed Care – PPO | Attending: Emergency Medicine | Admitting: Emergency Medicine

## 2022-08-10 DIAGNOSIS — R0781 Pleurodynia: Secondary | ICD-10-CM | POA: Diagnosis not present

## 2022-08-10 DIAGNOSIS — I480 Paroxysmal atrial fibrillation: Secondary | ICD-10-CM

## 2022-08-10 DIAGNOSIS — R079 Chest pain, unspecified: Secondary | ICD-10-CM | POA: Diagnosis not present

## 2022-08-10 DIAGNOSIS — I10 Essential (primary) hypertension: Secondary | ICD-10-CM | POA: Diagnosis not present

## 2022-08-10 DIAGNOSIS — R000 Tachycardia, unspecified: Secondary | ICD-10-CM | POA: Diagnosis not present

## 2022-08-10 DIAGNOSIS — I4891 Unspecified atrial fibrillation: Secondary | ICD-10-CM | POA: Diagnosis not present

## 2022-08-10 LAB — CBC WITH DIFFERENTIAL/PLATELET
Abs Immature Granulocytes: 0.03 10*3/uL (ref 0.00–0.07)
Basophils Absolute: 0 10*3/uL (ref 0.0–0.1)
Basophils Relative: 0 %
Eosinophils Absolute: 0.1 10*3/uL (ref 0.0–0.5)
Eosinophils Relative: 1 %
HCT: 43.9 % (ref 39.0–52.0)
Hemoglobin: 15 g/dL (ref 13.0–17.0)
Immature Granulocytes: 0 %
Lymphocytes Relative: 17 %
Lymphs Abs: 1.9 10*3/uL (ref 0.7–4.0)
MCH: 29.7 pg (ref 26.0–34.0)
MCHC: 34.2 g/dL (ref 30.0–36.0)
MCV: 86.9 fL (ref 80.0–100.0)
Monocytes Absolute: 0.6 10*3/uL (ref 0.1–1.0)
Monocytes Relative: 6 %
Neutro Abs: 8.4 10*3/uL — ABNORMAL HIGH (ref 1.7–7.7)
Neutrophils Relative %: 76 %
Platelets: 277 10*3/uL (ref 150–400)
RBC: 5.05 MIL/uL (ref 4.22–5.81)
RDW: 12.2 % (ref 11.5–15.5)
WBC: 11.1 10*3/uL — ABNORMAL HIGH (ref 4.0–10.5)
nRBC: 0 % (ref 0.0–0.2)

## 2022-08-10 LAB — COMPREHENSIVE METABOLIC PANEL
ALT: 16 U/L (ref 0–44)
AST: 21 U/L (ref 15–41)
Albumin: 4.2 g/dL (ref 3.5–5.0)
Alkaline Phosphatase: 66 U/L (ref 38–126)
Anion gap: 9 (ref 5–15)
BUN: 14 mg/dL (ref 6–20)
CO2: 24 mmol/L (ref 22–32)
Calcium: 8.9 mg/dL (ref 8.9–10.3)
Chloride: 105 mmol/L (ref 98–111)
Creatinine, Ser: 0.92 mg/dL (ref 0.61–1.24)
GFR, Estimated: >60 mL/min (ref >60)
Glucose, Bld: 105 mg/dL — ABNORMAL HIGH (ref 70–99)
Potassium: 3.8 mmol/L (ref 3.5–5.1)
Sodium: 138 mmol/L (ref 135–145)
Total Bilirubin: 1.1 mg/dL (ref 0.3–1.2)
Total Protein: 7 g/dL (ref 6.5–8.1)

## 2022-08-10 LAB — MAGNESIUM: Magnesium: 2.1 mg/dL (ref 1.7–2.4)

## 2022-08-10 LAB — ETHANOL: Alcohol, Ethyl (B): <10 mg/dL (ref <10)

## 2022-08-10 MED ORDER — METOPROLOL TARTRATE 25 MG PO TABS
50.0000 mg | ORAL_TABLET | Freq: Once | ORAL | Status: AC
  Administered 2022-08-10: 50 mg via ORAL
  Filled 2022-08-10: qty 2

## 2022-08-10 MED ORDER — METOPROLOL TARTRATE 50 MG PO TABS: 25.0000 mg | ORAL_TABLET | Freq: Two times a day (BID) | ORAL | 1 refills | Status: AC

## 2022-08-10 MED ORDER — APIXABAN 5 MG PO TABS: 5.0000 mg | ORAL_TABLET | Freq: Two times a day (BID) | ORAL | Status: DC
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
## 2022-08-10 NOTE — Discharge Instructions (Signed)
Your testing has been reassuring however your heart rate has gone back into what is called atrial fibrillation, very similar to what happened to you in 2012.  I want you to take the medication called metoprolol tartrate, 1 tablet twice a day to help keep your heart rate slow.  You will need to follow-up with a cardiologist to make sure that this is not getting any worse but come back to the ER for severe or worsening symptoms.  Your chest x-ray was normal,  You may take Tylenol or ibuprofen as needed for pain

## 2022-08-14 DIAGNOSIS — M7582 Other shoulder lesions, left shoulder: Secondary | ICD-10-CM | POA: Diagnosis not present

## 2022-08-14 DIAGNOSIS — S335XXA Sprain of ligaments of lumbar spine, initial encounter: Secondary | ICD-10-CM | POA: Diagnosis not present

## 2022-08-14 DIAGNOSIS — M7712 Lateral epicondylitis, left elbow: Secondary | ICD-10-CM | POA: Diagnosis not present

## 2022-08-15 ENCOUNTER — Telehealth (HOSPITAL_COMMUNITY): Payer: Self-pay

## 2022-08-16 ENCOUNTER — Telehealth: Payer: Self-pay | Admitting: Pharmacist

## 2022-08-16 ENCOUNTER — Encounter: Payer: Self-pay | Admitting: Cardiovascular Disease

## 2022-08-16 ENCOUNTER — Ambulatory Visit: Payer: BC Managed Care – PPO | Attending: Cardiovascular Disease | Admitting: Cardiovascular Disease

## 2022-08-16 VITALS — BP 132/64 | HR 81 | Ht 76.0 in | Wt 270.4 lb

## 2022-08-16 DIAGNOSIS — I480 Paroxysmal atrial fibrillation: Secondary | ICD-10-CM

## 2022-08-16 DIAGNOSIS — G4733 Obstructive sleep apnea (adult) (pediatric): Secondary | ICD-10-CM | POA: Diagnosis not present

## 2022-08-16 MED ORDER — APIXABAN 5 MG PO TABS
5.0000 mg | ORAL_TABLET | Freq: Two times a day (BID) | ORAL | 1 refills | Status: AC
Start: 2022-08-16 — End: ?

## 2022-08-16 MED ORDER — APIXABAN 5 MG PO TABS
5.0000 mg | ORAL_TABLET | Freq: Two times a day (BID) | ORAL | 0 refills | Status: DC
Start: 2022-08-16 — End: 2022-10-08

## 2022-08-16 NOTE — Assessment & Plan Note (Signed)
Patient apparently does not have a history of obstructive sleep apnea but does have nocturnal snoring.  Does not complain of daytime somnolence.
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
## 2022-08-16 NOTE — Telephone Encounter (Signed)
Pt was started on Eliquis for a fib on  08/16/22 .    Reviewed patients medication list.  Pt is not currently on any combined P-gp and strong CYP3A4 inhibitors/inducers (ketoconazole, traconazole, ritonavir, carbamazepine, phenytoin, rifampin, St. John's wort).  Reviewed labs.  SCr 0.92, Weight 270, CrCl-   Dose appropriate based on age, weight, and SCr.  Hgb and HCT Within Normal Limits  A full discussion of the nature of anticoagulants has been carried out.  A benefit/risk analysis has been presented to the patient, so that they understand the justification for choosing anticoagulation with Eliquis at this time.  The need for compliance is stressed.  Pt is aware to take the medication twice daily.  Side effects of potential bleeding are discussed, including unusual colored urine or stools, coughing up blood or coffee ground emesis, nose bleeds or serious fall or head trauma.  Discussed signs and symptoms of stroke. The patient should avoid any OTC items containing aspirin or ibuprofen.  Avoid alcohol consumption.   Call if any signs of abnormal bleeding.  Discussed financial obligations and resolved any difficulty in obtaining medication.    Gave 28 sample tablets. Advised to not take etodolac with Eliquis. Gave copay card

## 2022-08-16 NOTE — Patient Instructions (Signed)
Medication Instructions:  Your physician has recommended you make the following change in your medication:   -Start taking apixaban (Eliquis) 5mg  twice daily.  *If you need a refill on your cardiac medications before your next appointment, please call your pharmacy*   Testing/Procedures: Your physician has requested that you have an echocardiogram. Echocardiography is a painless test that uses sound waves to create images of your heart. It provides your doctor with information about the size and shape of your heart and how well your heart's chambers and valves are working. This procedure takes approximately one hour. There are no restrictions for this procedure. Please do NOT wear cologne, perfume, aftershave, or lotions (deodorant is allowed). Please arrive 15 minutes prior to your appointment time. This will take place at [address]    Follow-Up: At [facility], you and your health needs are our priority.  As part of our continuing mission to provide you with exceptional heart care, we have created designated Provider Care Teams.  These Care Teams include your primary Cardiologist (physician) and Advanced Practice Providers (APPs -  Physician Assistants and Nurse Practitioners) who all work together to provide you with the care you need, when you need it.  We recommend signing up for the patient portal called "MyChart".  Sign up information is provided on this After Visit Summary.  MyChart is used to connect with patients for Virtual Visits (Telemedicine).  Patients are able to view lab/test results, encounter notes, upcoming appointments, etc.  Non-urgent messages can be sent to your provider as well.   To learn more about what you can do with MyChart, go to [email or web address].    Your next appointment:   3 month(s)  Provider:   Nanetta Batty, MD

## 2022-08-27 ENCOUNTER — Telehealth: Payer: Self-pay | Admitting: Cardiovascular Disease

## 2022-08-27 DIAGNOSIS — M7712 Lateral epicondylitis, left elbow: Secondary | ICD-10-CM | POA: Diagnosis not present

## 2022-08-27 DIAGNOSIS — S335XXA Sprain of ligaments of lumbar spine, initial encounter: Secondary | ICD-10-CM | POA: Diagnosis not present

## 2022-08-27 DIAGNOSIS — M7582 Other shoulder lesions, left shoulder: Secondary | ICD-10-CM | POA: Diagnosis not present

## 2022-08-27 NOTE — Telephone Encounter (Signed)
Paper Work Dropped Off: Work related paperwork   Date: 06.18.24  Location of paper: handed to [place]

## 2022-08-30 NOTE — Telephone Encounter (Signed)
Follow Up:       Patient's work is calling to see if his paperwork is redy please?

## 2022-08-30 NOTE — Telephone Encounter (Signed)
Called pt's wife back to discuss paperwork. Paperwork is completed and will be left at the front deck to pick up. They plan to come on Monday to pick up.

## 2022-09-02 ENCOUNTER — Ambulatory Visit (HOSPITAL_COMMUNITY): Payer: BC Managed Care – PPO | Admitting: Internal Medicine

## 2022-09-11 ENCOUNTER — Telehealth: Payer: Self-pay | Admitting: Cardiovascular Disease

## 2022-09-11 NOTE — Telephone Encounter (Signed)
Good morning, Patient came in and stated that he need a Note for him to return back to work. I put in the envelope with the job description. Patient stated that Allyson Sabal could Sign the job description paperwork. Best contact number for patient is [phone] and his wife number is [phone] [facility]. I put in provider box

## 2022-09-17 NOTE — Telephone Encounter (Signed)
Called pt to let him know that return to work letter has been completed and is available for pick up. Pt mentions that he has an appointment in the next few days and he will pick up then. Reviewed appointment tab, informed pt that his echocardiogram is scheduled for Thursday 7/11 @ 8:20am. All informed pt that the echo will be done at our [facility] location. Pt then asks if "the procedure" would be scheduled after that. Reviewed pt's chart. Informed pt that Dr. Allyson Sabal had recommended that he be seen in the a-fib clinic to advise on next steps, however pt no showed this appointment that was for 6/24 at 9:30am. Pt states that he was not aware of this appointment. Advised pt to reach back out to a-fib clinic to remake this appointment. Pt verbalizes that he will give them a call to make appointment.

## 2022-09-19 ENCOUNTER — Ambulatory Visit (HOSPITAL_COMMUNITY): Payer: BC Managed Care – PPO | Attending: Cardiology

## 2022-09-19 DIAGNOSIS — I480 Paroxysmal atrial fibrillation: Secondary | ICD-10-CM | POA: Insufficient documentation

## 2022-09-19 DIAGNOSIS — G4733 Obstructive sleep apnea (adult) (pediatric): Secondary | ICD-10-CM | POA: Insufficient documentation

## 2022-09-19 LAB — ECHOCARDIOGRAM COMPLETE: S' Lateral: 3.7 cm

## 2022-09-23 ENCOUNTER — Other Ambulatory Visit (HOSPITAL_COMMUNITY): Payer: Self-pay

## 2022-09-23 DIAGNOSIS — G4733 Obstructive sleep apnea (adult) (pediatric): Secondary | ICD-10-CM

## 2022-09-23 DIAGNOSIS — I480 Paroxysmal atrial fibrillation: Secondary | ICD-10-CM

## 2022-10-07 ENCOUNTER — Ambulatory Visit (HOSPITAL_COMMUNITY)
Admission: RE | Admit: 2022-10-07 | Discharge: 2022-10-07 | Disposition: A | Discharge status: Home / Self Care | Payer: BC Managed Care – PPO | Source: Ambulatory Visit | Attending: Internal Medicine | Admitting: Internal Medicine

## 2022-10-07 ENCOUNTER — Encounter (HOSPITAL_COMMUNITY): Payer: Self-pay | Admitting: Internal Medicine

## 2022-10-07 ENCOUNTER — Other Ambulatory Visit (HOSPITAL_COMMUNITY): Payer: Self-pay | Admitting: Internal Medicine

## 2022-10-07 VITALS — BP 124/78 | HR 85 | Ht 76.0 in | Wt 269.0 lb

## 2022-10-07 DIAGNOSIS — I480 Paroxysmal atrial fibrillation: Secondary | ICD-10-CM

## 2022-10-07 LAB — BASIC METABOLIC PANEL
Anion gap: 10 (ref 5–15)
BUN: 12 mg/dL (ref 6–20)
CO2: 26 mmol/L (ref 22–32)
Calcium: 9.3 mg/dL (ref 8.9–10.3)
Chloride: 103 mmol/L (ref 98–111)
Creatinine, Ser: 0.98 mg/dL (ref 0.61–1.24)
GFR, Estimated: >60 mL/min (ref >60)
Glucose, Bld: 92 mg/dL (ref 70–99)
Potassium: 4.3 mmol/L (ref 3.5–5.1)
Sodium: 139 mmol/L (ref 135–145)

## 2022-10-07 LAB — CBC
HCT: 43.4 % (ref 39.0–52.0)
Hemoglobin: 14.7 g/dL (ref 13.0–17.0)
MCH: 30.8 pg (ref 26.0–34.0)
MCHC: 33.9 g/dL (ref 30.0–36.0)
MCV: 90.8 fL (ref 80.0–100.0)
Platelets: 239 10*3/uL (ref 150–400)
RBC: 4.78 MIL/uL (ref 4.22–5.81)
RDW: 12.4 % (ref 11.5–15.5)
WBC: 6.6 10*3/uL (ref 4.0–10.5)
nRBC: 0 % (ref 0.0–0.2)

## 2022-10-07 NOTE — Patient Instructions (Signed)
Cardioversion scheduled for: August 2nd 2024   - Arrive at the [facility] and go to admitting at 9:30am   - Do not eat or drink anything after midnight the night prior to your procedure.   - Take all your morning medication (except diabetic medications) with a sip of water prior to arrival.  - You will not be able to drive home after your procedure.    - Do NOT miss any doses of your blood thinner - if you should miss a dose please notify our office immediately.   - If you feel as if you go back into normal rhythm prior to scheduled cardioversion, please notify our office immediately.   If your procedure is canceled in the cardioversion suite you will be charged a cancellation fee.        For those patients who have a scheduled procedure/anesthesia on the same day of the week as their dose, hold the medication on the day of surgery.  They can take their scheduled dose the week before.  **Patients on the above medications scheduled for elective procedures that have not held the medication for the appropriate amount of time are at risk of cancellation or change in the anesthetic plan.
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
## 2022-10-08 DIAGNOSIS — M7712 Lateral epicondylitis, left elbow: Secondary | ICD-10-CM | POA: Diagnosis not present

## 2022-10-08 DIAGNOSIS — S335XXA Sprain of ligaments of lumbar spine, initial encounter: Secondary | ICD-10-CM | POA: Diagnosis not present

## 2022-10-08 DIAGNOSIS — M7582 Other shoulder lesions, left shoulder: Secondary | ICD-10-CM | POA: Diagnosis not present

## 2022-10-10 NOTE — Progress Notes (Signed)
Spoke to pt and instructed them to come at 0745 and to be NPO after 0000. Confirmed no missed doses of AC and instructed to take in AM with a small sip of water.   Confirmed that pt will have a ride home and someone to stay with them for 24 hours after the procedure.

## 2022-10-11 ENCOUNTER — Other Ambulatory Visit: Payer: Self-pay

## 2022-10-11 ENCOUNTER — Encounter (HOSPITAL_COMMUNITY): Payer: Self-pay | Admitting: Cardiology

## 2022-10-11 ENCOUNTER — Ambulatory Visit (HOSPITAL_COMMUNITY): Payer: BC Managed Care – PPO | Admitting: Anesthesiology

## 2022-10-11 ENCOUNTER — Ambulatory Visit (HOSPITAL_COMMUNITY)
Admission: RE | Admit: 2022-10-11 | Discharge: 2022-10-11 | Disposition: A | Discharge status: Home / Self Care | Payer: BC Managed Care – PPO | Source: Ambulatory Visit | Attending: Cardiology | Admitting: Cardiology

## 2022-10-11 ENCOUNTER — Encounter (HOSPITAL_COMMUNITY)
Admission: RE | Disposition: A | Discharge status: Home / Self Care | Payer: Self-pay | Source: Ambulatory Visit | Attending: Cardiology

## 2022-10-11 DIAGNOSIS — I4819 Other persistent atrial fibrillation: Secondary | ICD-10-CM

## 2022-10-11 DIAGNOSIS — I480 Paroxysmal atrial fibrillation: Secondary | ICD-10-CM | POA: Diagnosis not present

## 2022-10-11 DIAGNOSIS — Z7901 Long term (current) use of anticoagulants: Secondary | ICD-10-CM | POA: Diagnosis not present

## 2022-10-11 DIAGNOSIS — G4733 Obstructive sleep apnea (adult) (pediatric): Secondary | ICD-10-CM | POA: Diagnosis not present

## 2022-10-11 DIAGNOSIS — I4891 Unspecified atrial fibrillation: Secondary | ICD-10-CM | POA: Diagnosis not present

## 2022-10-11 HISTORY — PX: CARDIOVERSION: SHX1299

## 2022-10-11 SURGERY — CARDIOVERSION
Anesthesia: General

## 2022-10-11 MED ORDER — LIDOCAINE 2% (20 MG/ML) 5 ML SYRINGE
INTRAMUSCULAR | Status: DC | PRN
  Administered 2022-10-11: 40 mg via INTRAVENOUS

## 2022-10-11 MED ORDER — PROPOFOL 10 MG/ML IV BOLUS
INTRAVENOUS | Status: DC | PRN
  Administered 2022-10-11: 70 mg via INTRAVENOUS

## 2022-10-11 MED ORDER — SODIUM CHLORIDE 0.9 % IV SOLN: INTRAVENOUS | Status: DC

## 2022-10-11 SURGICAL SUPPLY — 1 items: ELECT DEFIB PAD ADLT CADENCE (PAD) ×1 IMPLANT

## 2022-10-11 NOTE — Anesthesia Postprocedure Evaluation (Signed)
Anesthesia Post Note  Patient: Charles Olsen  Procedure(s) Performed: CARDIOVERSION     Patient location during evaluation: PACU Anesthesia Type: General Level of consciousness: awake and alert Pain management: pain level controlled Vital Signs Assessment: post-procedure vital signs reviewed and stable Respiratory status: spontaneous breathing, nonlabored ventilation, respiratory function stable and patient connected to nasal cannula oxygen Cardiovascular status: blood pressure returned to baseline and stable Postop Assessment: no apparent nausea or vomiting Anesthetic complications: no  No notable events documented.  Last Vitals:  Vitals:   10/11/22 0940 10/11/22 0949  BP: (!) 108/95 123/87  Pulse: 72 73  Resp: 15 18  Temp:  36.9 C  SpO2: 99% 98%    Last Pain:  Vitals:   10/11/22 0753  TempSrc: Temporal  PainSc: 0-No pain                 Shelton Silvas

## 2022-10-11 NOTE — Interval H&P Note (Signed)
History and Physical Interval Note:  10/11/2022 8:38 AM  Charles Olsen  has presented today for surgery, with the diagnosis of AFIB.  The various methods of treatment have been discussed with the patient and family. After consideration of risks, benefits and other options for treatment, the patient has consented to  Procedure(s): CARDIOVERSION (N/A) as a surgical intervention.  The patient's history has been reviewed, patient examined, no change in status, stable for surgery.  I have reviewed the patient's chart and labs.  Questions were answered to the patient's satisfaction.     [name]

## 2022-10-11 NOTE — CV Procedure (Signed)
Procedure:   DCCV  Indication:  Symptomatic atrial ibrillation  Procedure Note:  The patient signed informed consent.  They have had had therapeutic anticoagulation with Eliquis greater than 3 weeks.  Anesthesia was administered by Dr. Hart Rochester and Reggy Eye, CRNA.  Adequate airway was maintained throughout and vital followed per protocol.  They were cardioverted x 1 with 200J of biphasic synchronized energy.  They converted to NSR with rate 70s.  There were no apparent complications.  The patient had normal neuro status and respiratory status post procedure with vitals stable as recorded elsewhere.    Follow up:  They will continue on current medical therapy and follow up with cardiology as scheduled.  Epifanio Lesches, MD 10/11/2022 9:24 AM

## 2022-10-11 NOTE — Transfer of Care (Signed)
Immediate Anesthesia Transfer of Care Note  Patient: Charles Olsen  Procedure(s) Performed: CARDIOVERSION  Patient Location: PACU and Cath Lab  Anesthesia Type:MAC  Level of Consciousness: awake and alert   Airway & Oxygen Therapy: Patient Spontanous Breathing  Post-op Assessment: Report given to RN  Post vital signs: stable  Last Vitals:  Vitals Value Taken Time  BP    Temp    Pulse 78 10/11/22 0907  Resp 16 10/11/22 0907  SpO2 99 % 10/11/22 0907  Vitals shown include unfiled device data.  Last Pain:  Vitals:   10/11/22 0753  TempSrc: Temporal  PainSc: 0-No pain         Complications: No notable events documented.
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
## 2022-10-14 ENCOUNTER — Encounter (HOSPITAL_COMMUNITY): Payer: Self-pay | Admitting: Cardiology

## 2022-10-18 ENCOUNTER — Encounter (HOSPITAL_COMMUNITY): Payer: Self-pay | Admitting: Internal Medicine

## 2022-10-18 ENCOUNTER — Ambulatory Visit (HOSPITAL_COMMUNITY)
Admission: RE | Admit: 2022-10-18 | Payer: BC Managed Care – PPO | Source: Ambulatory Visit | Admitting: Internal Medicine

## 2022-10-18 VITALS — BP 122/78 | HR 65 | Ht 76.0 in | Wt 274.4 lb

## 2022-10-18 DIAGNOSIS — I4819 Other persistent atrial fibrillation: Secondary | ICD-10-CM | POA: Insufficient documentation

## 2022-10-18 DIAGNOSIS — G4733 Obstructive sleep apnea (adult) (pediatric): Secondary | ICD-10-CM | POA: Insufficient documentation

## 2022-10-18 DIAGNOSIS — I480 Paroxysmal atrial fibrillation: Secondary | ICD-10-CM | POA: Diagnosis not present
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
## 2022-10-18 NOTE — Patient Instructions (Addendum)
Stop your Eliquis after 8/30

## 2022-11-27 ENCOUNTER — Ambulatory Visit: Payer: BC Managed Care – PPO | Attending: Cardiovascular Disease | Admitting: Cardiovascular Disease

## 2022-11-28 ENCOUNTER — Encounter: Payer: Self-pay | Admitting: Cardiovascular Disease

## 2023-03-28 ENCOUNTER — Encounter (HOSPITAL_COMMUNITY): Payer: Self-pay

## 2023-04-05 IMAGING — DX DG CHEST 2V
2 series · 2 of 2 positions shown · non-contrast
Comparison: April 13, 2018

CLINICAL DATA: Cough.

EXAM:
CHEST - 2 VIEW

[chest pa]
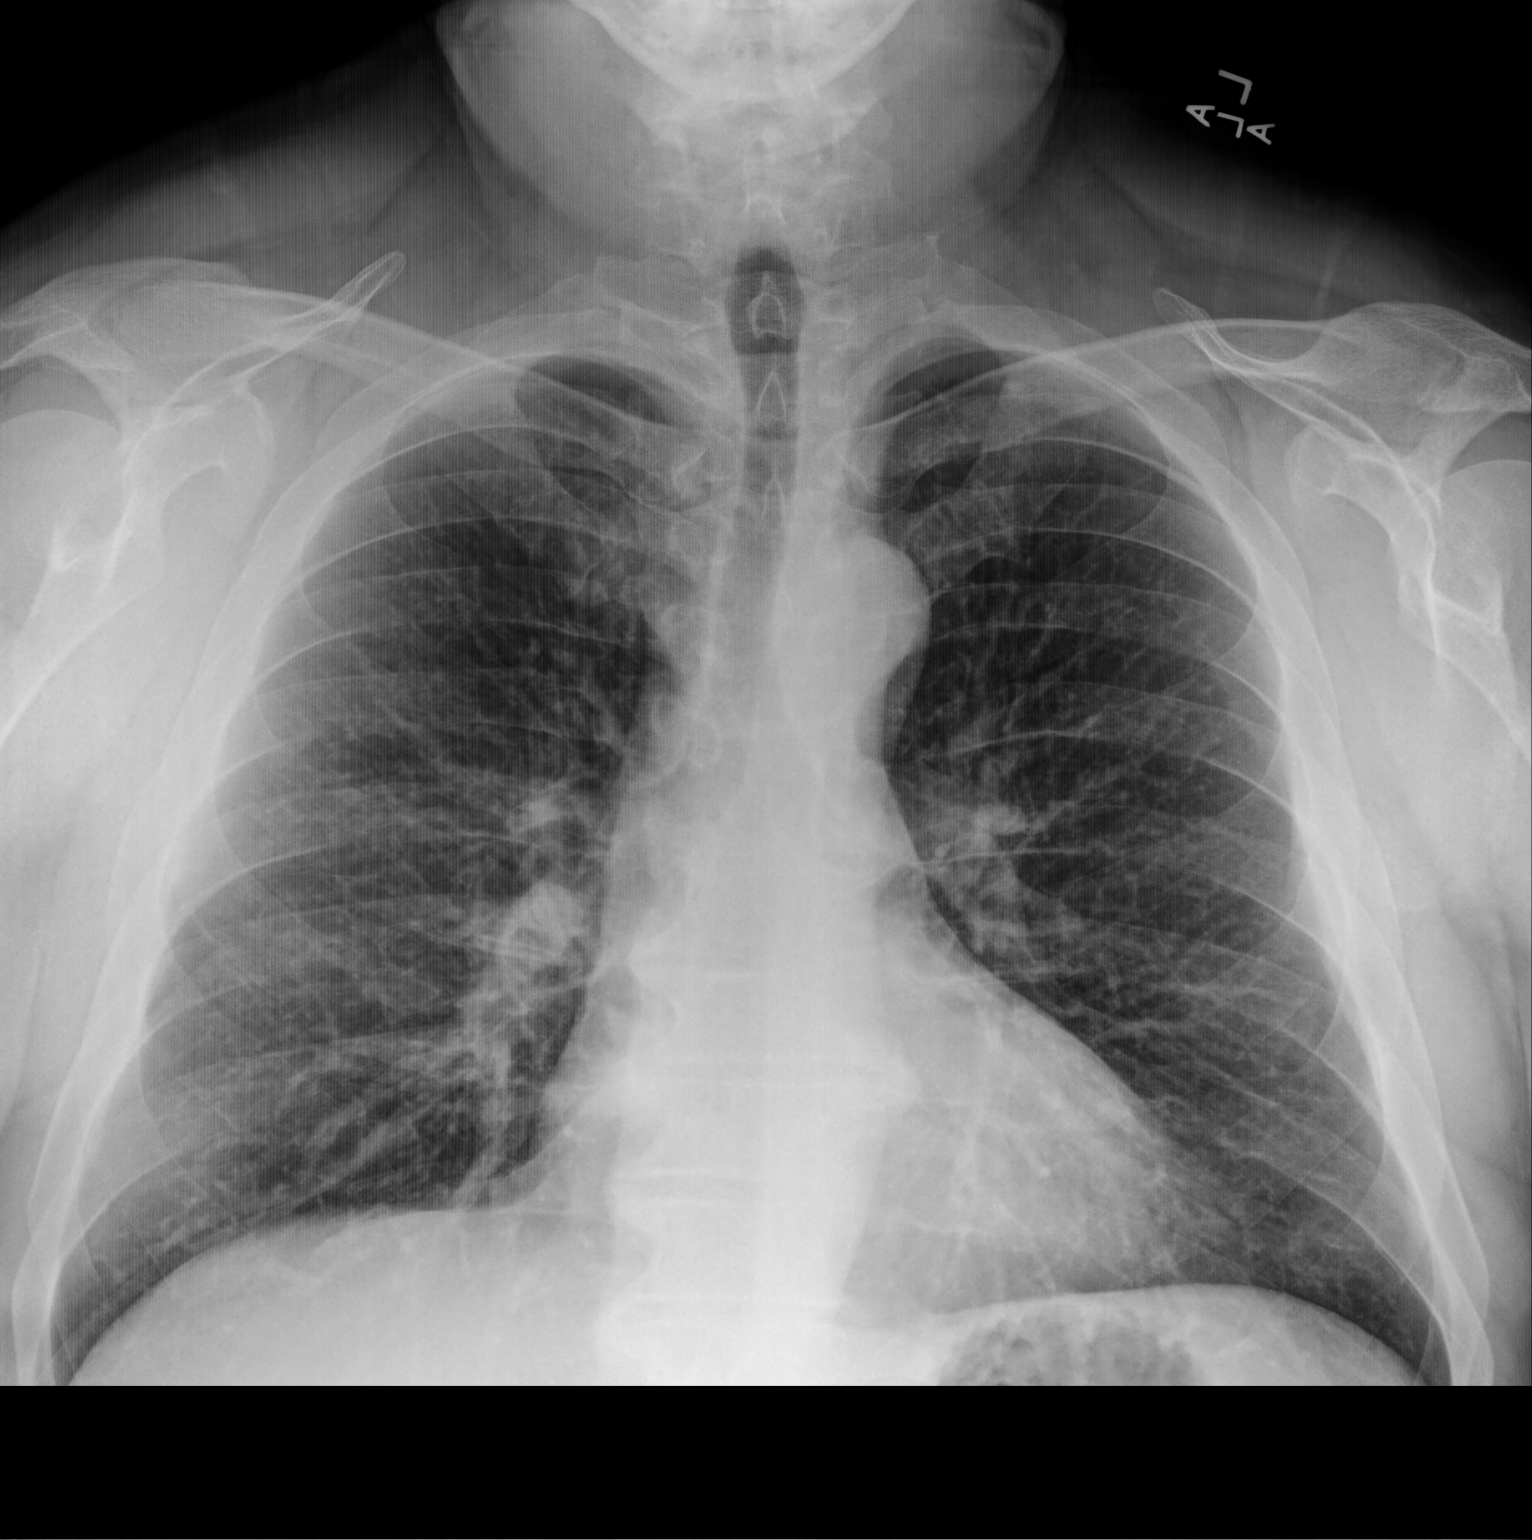

[chest lat]
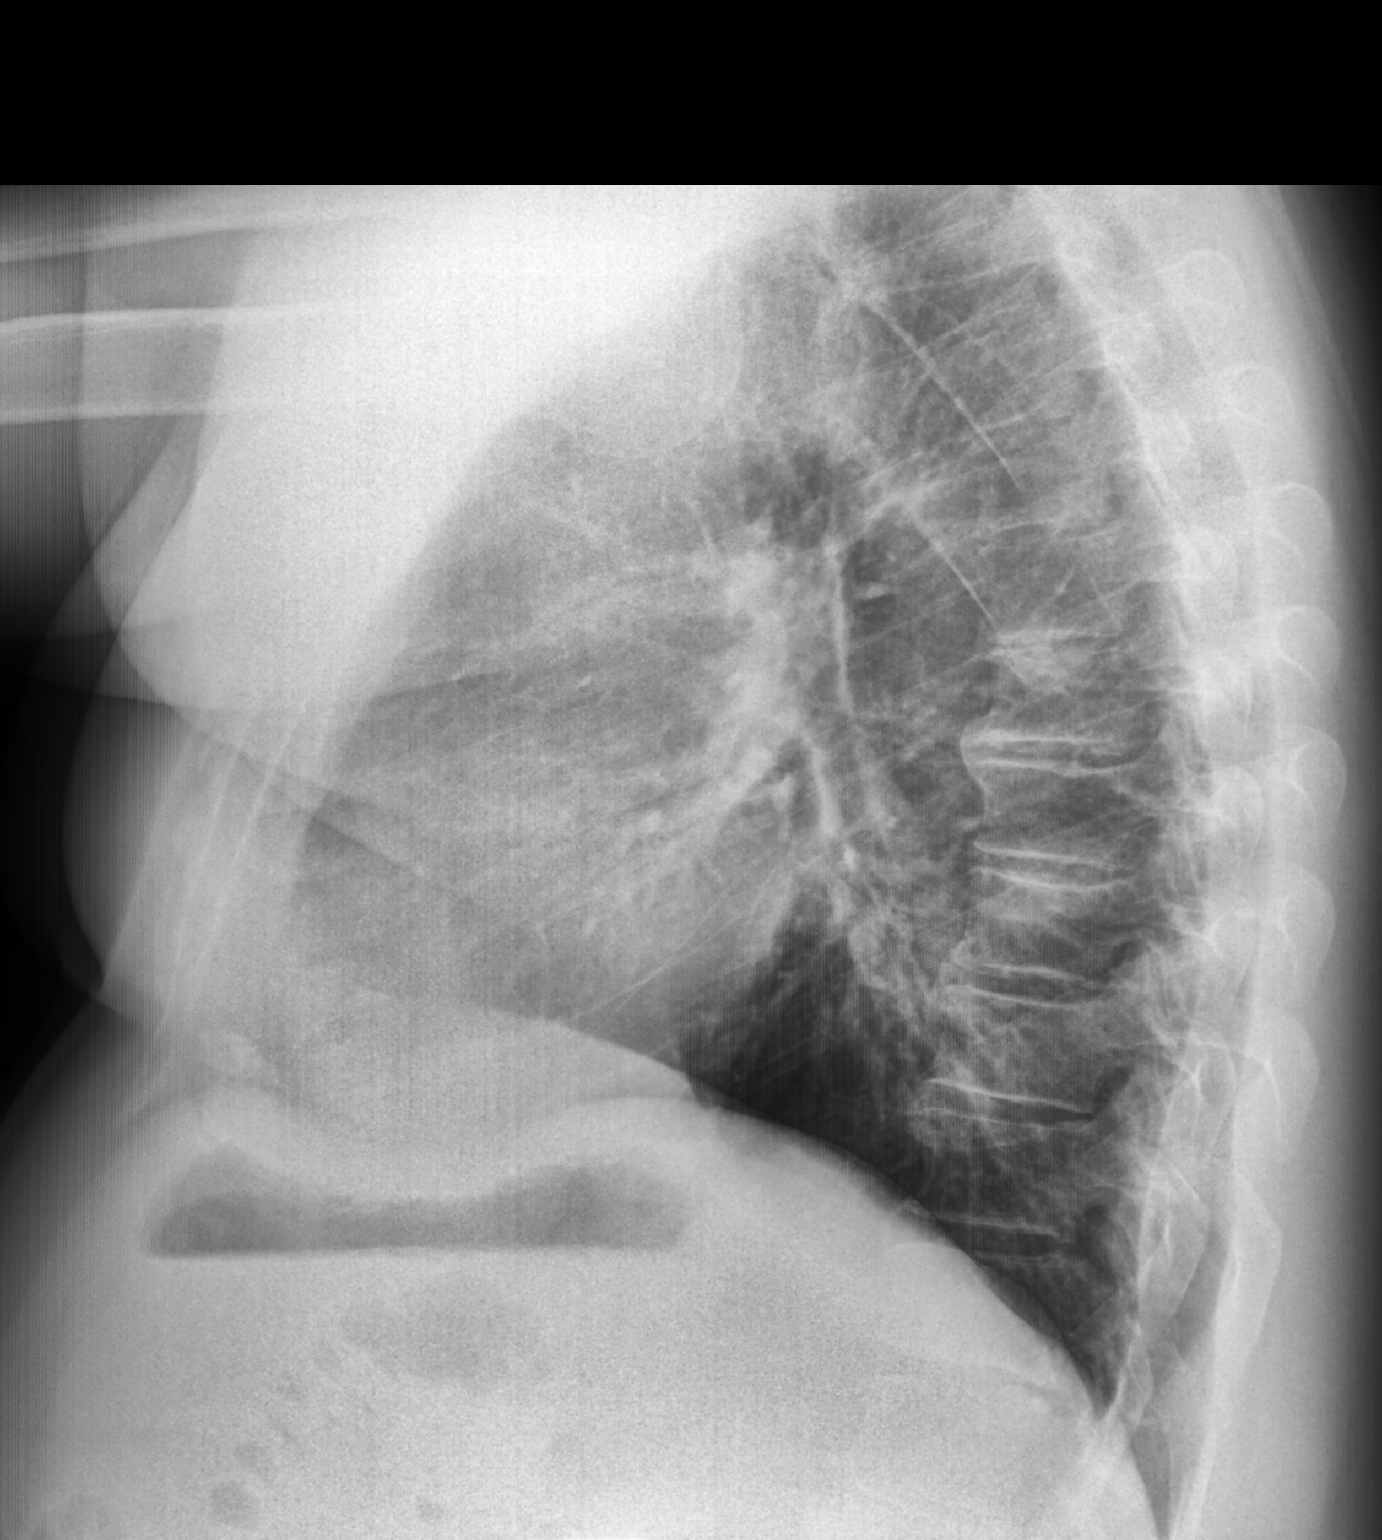

[2 of 2 positions shown; findings below may reference images not displayed]

FINDINGS: The heart size and mediastinal contours are within normal limits.
Both lungs are clear. The visualized skeletal structures are
unremarkable.
IMPRESSION: No active cardiopulmonary disease.

## 2023-04-21 ENCOUNTER — Ambulatory Visit (HOSPITAL_COMMUNITY): Payer: BC Managed Care – PPO | Admitting: Internal Medicine

## 2023-04-22 ENCOUNTER — Encounter (HOSPITAL_COMMUNITY): Payer: Self-pay

## 2023-04-22 ENCOUNTER — Ambulatory Visit (HOSPITAL_COMMUNITY): Payer: BC Managed Care – PPO | Admitting: Internal Medicine
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
## 2023-09-22 ENCOUNTER — Telehealth (HOSPITAL_COMMUNITY): Payer: Self-pay | Admitting: Cardiovascular Disease

## 2023-09-22 NOTE — Telephone Encounter (Signed)
 Patient called an cancelled echocardiogram and will callback to reschedule. Order will be removed from the active echo WQ and if patient calls back we will reinstate the order. Thank you.

## 2023-09-22 NOTE — Telephone Encounter (Signed)
 fyi

## 2023-09-23 ENCOUNTER — Ambulatory Visit (HOSPITAL_COMMUNITY)
